# Patient Record
Sex: Female | Born: 2000 | Race: White | Hispanic: Yes | Marital: Single | Smoking: Never smoker
Health system: Southern US, Community
[De-identification: ages and names within clinical notes are randomized; demographics above are authoritative.]

## PROBLEM LIST (undated history)

## (undated) DIAGNOSIS — F419 Anxiety disorder, unspecified: Secondary | ICD-10-CM

## (undated) DIAGNOSIS — H9193 Unspecified hearing loss, bilateral: Secondary | ICD-10-CM

## (undated) DIAGNOSIS — D649 Anemia, unspecified: Secondary | ICD-10-CM

## (undated) DIAGNOSIS — I499 Cardiac arrhythmia, unspecified: Secondary | ICD-10-CM

## (undated) DIAGNOSIS — K589 Irritable bowel syndrome without diarrhea: Secondary | ICD-10-CM

## (undated) DIAGNOSIS — J45909 Unspecified asthma, uncomplicated: Secondary | ICD-10-CM

## (undated) DIAGNOSIS — F418 Other specified anxiety disorders: Secondary | ICD-10-CM

## (undated) DIAGNOSIS — R7303 Prediabetes: Secondary | ICD-10-CM

## (undated) DIAGNOSIS — K219 Gastro-esophageal reflux disease without esophagitis: Secondary | ICD-10-CM

## (undated) DIAGNOSIS — J302 Other seasonal allergic rhinitis: Secondary | ICD-10-CM

## (undated) DIAGNOSIS — T7840XA Allergy, unspecified, initial encounter: Secondary | ICD-10-CM

## (undated) DIAGNOSIS — J45990 Exercise induced bronchospasm: Secondary | ICD-10-CM

## (undated) DIAGNOSIS — E119 Type 2 diabetes mellitus without complications: Secondary | ICD-10-CM

## (undated) DIAGNOSIS — G43909 Migraine, unspecified, not intractable, without status migrainosus: Secondary | ICD-10-CM

## (undated) DIAGNOSIS — Z9049 Acquired absence of other specified parts of digestive tract: Secondary | ICD-10-CM

## (undated) HISTORY — DX: Migraine, unspecified, not intractable, without status migrainosus: G43.909

## (undated) HISTORY — DX: Irritable bowel syndrome, unspecified: K58.9

## (undated) HISTORY — DX: Morbid (severe) obesity due to excess calories: E66.01

## (undated) HISTORY — DX: Cardiac arrhythmia, unspecified: I49.9

## (undated) HISTORY — DX: Other seasonal allergic rhinitis: J30.2

## (undated) HISTORY — DX: Anemia, unspecified: D64.9

## (undated) HISTORY — PX: CHOLECYSTECTOMY: SHX55

## (undated) HISTORY — DX: Exercise induced bronchospasm: J45.990

## (undated) HISTORY — DX: Other specified anxiety disorders: F41.8

## (undated) HISTORY — DX: Allergy, unspecified, initial encounter: T78.40XA

## (undated) HISTORY — DX: Type 2 diabetes mellitus without complications: E11.9

## (undated) HISTORY — DX: Anxiety disorder, unspecified: F41.9

## (undated) HISTORY — DX: Prediabetes: R73.03

## (undated) HISTORY — DX: Acquired absence of other specified parts of digestive tract: Z90.49

---
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
## 2019-02-06 DIAGNOSIS — H9193 Unspecified hearing loss, bilateral: Secondary | ICD-10-CM | POA: Insufficient documentation

## 2019-08-12 ENCOUNTER — Ambulatory Visit (HOSPITAL_COMMUNITY)
Admission: EM | Admit: 2019-08-12 | Discharge: 2019-08-12 | Disposition: A | Discharge status: Home / Self Care | Payer: Medicaid Other | Attending: Radiology | Admitting: Radiology

## 2019-08-12 ENCOUNTER — Other Ambulatory Visit: Payer: Self-pay

## 2019-08-12 ENCOUNTER — Encounter (HOSPITAL_COMMUNITY): Payer: Self-pay

## 2019-08-12 DIAGNOSIS — J029 Acute pharyngitis, unspecified: Secondary | ICD-10-CM | POA: Diagnosis present

## 2019-08-12 DIAGNOSIS — Z20828 Contact with and (suspected) exposure to other viral communicable diseases: Secondary | ICD-10-CM | POA: Diagnosis not present

## 2019-08-12 LAB — POCT RAPID STREP A: Streptococcus, Group A Screen (Direct): NEGATIVE

## 2019-08-12 MED ORDER — CLARITIN-D 24 HOUR 10-240 MG PO TB24: 1.0000 | ORAL_TABLET | Freq: Every day | ORAL | 0 refills | Status: AC

## 2019-08-12 MED ORDER — CLINDAMYCIN HCL 150 MG PO CAPS: 300.0000 mg | ORAL_CAPSULE | Freq: Four times a day (QID) | ORAL | 0 refills | Status: AC

## 2019-08-12 NOTE — ED Triage Notes (Signed)
Pt present a sore throat, symptoms started on Thursday. Pt states it difficulty to swallow.

## 2019-08-12 NOTE — ED Provider Notes (Signed)
[facility]    CSN: [id number] Arrival date & time: 08/12/19  1439      History   Chief Complaint Chief Complaint  Patient presents with  . Sore Throat    HPI Sofie Rowerlsie Pavel is a 18 y.o. female.   18 y.o. female presents with sore throat  X 3 days. Condition is persistent in nature. Condition is made better by nothing. Condition is made worse by swallowing . Patient denies any treatment prior to there arrival at this facility. Patient reports a low grade fever of  99.1. denies any sinus or nasal congestion, headache, abdominal pain pain nausea vomiting or diarrhea.  Patient is a [employer] student who lives off campus. Patient denies any know exposures to COVID or strep      History reviewed. No pertinent past medical history.  There are no active problems to display for this patient.   Past Surgical History:  Procedure Laterality Date  . CHOLECYSTECTOMY      OB History   No obstetric history on file.      Home Medications    Prior to Admission medications   Not on File    Family History History reviewed. No pertinent family history.  Social History Social History   Tobacco Use  . Smoking status: Never Smoker  . Smokeless tobacco: Never Used  Substance Use Topics  . Alcohol use: Not on file  . Drug use: Not on file     Allergies   Penicillins   Review of Systems Review of Systems  Constitutional: Negative for chills and fever.  HENT: Positive for sore throat. Negative for congestion, ear pain and sinus pressure.   Eyes: Negative for pain and visual disturbance.  Respiratory: Negative for cough and shortness of breath.   Cardiovascular: Negative for chest pain and palpitations.  Gastrointestinal: Negative for abdominal pain and vomiting.  Genitourinary: Negative for dysuria and hematuria.  Musculoskeletal: Negative for arthralgias and back pain.  Skin: Negative for color change and rash.  Neurological: Negative for seizures and  syncope.  All other systems reviewed and are negative.    Physical Exam Triage Vital Signs ED Triage Vitals  Enc Vitals Group     BP 08/12/19 1500 127/68     Pulse Rate 08/12/19 1500 88     Resp --      Temp 08/12/19 1500 99.2 F (37.3 C)     Temp Source 08/12/19 1500 Skin     SpO2 08/12/19 1500 99 %     Weight --      Height --      Head Circumference --      Peak Flow --      Pain Score 08/12/19 1501 0     Pain Loc --      Pain Edu? --      Excl. in [place]? --    No data found.  Updated Vital Signs BP 127/68 (BP Location: Right Arm)   Pulse 88   Temp 99.2 F (37.3 C) (Skin)   SpO2 99%   Visual Acuity Right Eye Distance:   Left Eye Distance:   Bilateral Distance:    Right Eye Near:   Left Eye Near:    Bilateral Near:     Physical Exam Vitals signs and nursing note reviewed.  Constitutional:      Appearance: She is well-developed.     Comments: erythema noted no exudate.   HENT:     Head: Normocephalic and atraumatic.  Mouth/Throat:     Tonsils: No tonsillar exudate or tonsillar abscesses.  Eyes:     Conjunctiva/sclera: Conjunctivae normal.  Neck:     Musculoskeletal: Normal range of motion.  Pulmonary:     Effort: Pulmonary effort is normal.  Musculoskeletal: Normal range of motion.  Skin:    General: Skin is warm.  Neurological:     Mental Status: She is alert and oriented to person, place, and time.      UC Treatments / Results  Labs (all labs ordered are listed, but only abnormal results are displayed) Labs Reviewed - No data to display  EKG   Radiology No results found.  Procedures Procedures (including critical care time)  Medications Ordered in UC Medications - No data to display  Initial Impression / Assessment and Plan / UC Course  I have reviewed the triage vital signs and the nursing notes.  Pertinent labs & imaging results that were available during my care of the patient were reviewed by me and considered in my medical  decision making (see chart for details).      Final Clinical Impressions(s) / UC Diagnoses   Final diagnoses:  None   Discharge Instructions   None    ED Prescriptions    None     Controlled Substance Prescriptions [place] Controlled Substance Registry consulted? Not Applicable   Jacqualine Mau, NP 08/12/19 1520

## 2019-08-12 NOTE — Discharge Instructions (Signed)
f your COVID test is positive you will be contacted by the hospital. In the mean time please quarantine yourself. Continue to push fluids and take over the counter medications as directed on the back of the box for symptomatic relief.   If symptoms have not improved in 3-5 days please start antibiotics

## 2019-08-13 LAB — NOVEL CORONAVIRUS, NAA (HOSP ORDER, SEND-OUT TO REF LAB; TAT 18-24 HRS): SARS-CoV-2, NAA: NOT DETECTED

## 2019-08-14 LAB — CULTURE, GROUP A STREP (THRC)

## 2019-09-19 ENCOUNTER — Ambulatory Visit (HOSPITAL_COMMUNITY)
Admission: EM | Admit: 2019-09-19 | Discharge: 2019-09-19 | Disposition: A | Discharge status: Home / Self Care | Payer: Medicaid Other

## 2019-09-19 ENCOUNTER — Other Ambulatory Visit: Payer: Self-pay

## 2019-09-19 ENCOUNTER — Encounter (HOSPITAL_COMMUNITY): Payer: Self-pay | Admitting: Emergency Medicine

## 2019-09-19 DIAGNOSIS — H60501 Unspecified acute noninfective otitis externa, right ear: Secondary | ICD-10-CM | POA: Diagnosis not present

## 2019-09-19 HISTORY — DX: Unspecified hearing loss, bilateral: H91.93

## 2019-09-19 MED ORDER — NEOMYCIN-POLYMYXIN-HC 3.5-10000-1 OT SUSP: 4.0000 [drp] | Freq: Three times a day (TID) | OTIC | 0 refills | Status: DC

## 2019-09-19 NOTE — ED Notes (Signed)
Bed: UC05 Expected date:  Expected time:  Means of arrival:  Comments: Appt 1330

## 2019-09-19 NOTE — ED Triage Notes (Signed)
Pt reports right ear pain and ringing in the ear that started last night.  Pt wears hearing aids, but is unable to wear the one in the right ear at this time.  No OTC medications have been taken.

## 2019-09-19 NOTE — Discharge Instructions (Signed)
Use the eardrops as directed.    Follow-up with your ENT or the one suggested below if your symptoms persist or worsen.

## 2019-09-19 NOTE — ED Provider Notes (Signed)
[facility]    CSN: [id number] Arrival date & time: 09/19/19  1326      History   Chief Complaint Chief Complaint  Patient presents with  . Tinnitus    Appt 1330 right  . Otalgia    right    HPI Sara Cervantes is a 18 y.o. female.   Patient presents with right ear pain since yesterday.  She states her ear canal is tender and she is unable to put her hearing aid in due to the tenderness.  She has bilateral hearing aids.  She denies fever, chills, sore throat, cough, shortness of breath, vomiting, diarrhea, or other symptoms.  The history is provided by the patient.    Past Medical History:  Diagnosis Date  . Hearing deficit, bilateral     There are no active problems to display for this patient.   Past Surgical History:  Procedure Laterality Date  . CHOLECYSTECTOMY      OB History   No obstetric history on file.      Home Medications    Prior to Admission medications   Medication Sig Start Date End Date Taking? Authorizing Provider  etonogestrel (NEXPLANON) 68 MG IMPL implant Inject into the skin.   Yes [provider]  hydrOXYzine (ATARAX/VISTARIL) 10 MG tablet Take by mouth. 09/04/19  Yes [provider]  sertraline (ZOLOFT) 100 MG tablet Take 100 mg by mouth daily. 07/23/19  Yes [provider]  TRULANCE 3 MG TABS Take 1 tablet by mouth daily. 06/14/19  Yes [provider]  neomycin-polymyxin-hydrocortisone (CORTISPORIN) 3.5-10000-1 OTIC suspension Place 4 drops into the right ear 3 (three) times daily. 09/19/19   Sharion Balloon, NP    Family History Family History  Problem Relation Age of Onset  . Hypertension Mother   . Diabetes Father   . Hypertension Father   . Healthy Brother     Social History Social History   Tobacco Use  . Smoking status: Never Smoker  . Smokeless tobacco: Never Used  Substance Use Topics  . Alcohol use: Never    Frequency: Never  . Drug use: Never     Allergies    Penicillins   Review of Systems Review of Systems  Constitutional: Negative for chills and fever.  HENT: Positive for ear pain. Negative for sore throat.   Eyes: Negative for pain and visual disturbance.  Respiratory: Negative for cough and shortness of breath.   Cardiovascular: Negative for chest pain and palpitations.  Gastrointestinal: Negative for abdominal pain and vomiting.  Genitourinary: Negative for dysuria and hematuria.  Musculoskeletal: Negative for arthralgias and back pain.  Skin: Negative for color change and rash.  Neurological: Negative for seizures and syncope.  All other systems reviewed and are negative.    Physical Exam Triage Vital Signs ED Triage Vitals [09/19/19 1357]  Enc Vitals Group     BP      Pulse      Resp      Temp      Temp src      SpO2      Weight      Height      Head Circumference      Peak Flow      Pain Score 6     Pain Loc      Pain Edu?      Excl. in [place]?    No data found.  Updated Vital Signs BP 133/87 (BP Location: Right Wrist)   Pulse 78  Temp 98.5 F (36.9 C) (Oral)   Resp 18   SpO2 100%   Visual Acuity Right Eye Distance:   Left Eye Distance:   Bilateral Distance:    Right Eye Near:   Left Eye Near:    Bilateral Near:     Physical Exam Vitals signs and nursing note reviewed.  Constitutional:      General: She is not in acute distress.    Appearance: She is well-developed.  HENT:     Head: Normocephalic and atraumatic.     Right Ear: Tympanic membrane normal.     Left Ear: Tympanic membrane and ear canal normal.     Ears:     Comments: Mild erythema in right ear canal. No drainage.    Nose: Nose normal.     Mouth/Throat:     Mouth: Mucous membranes are moist.     Pharynx: Oropharynx is clear.  Eyes:     Conjunctiva/sclera: Conjunctivae normal.  Neck:     Musculoskeletal: Neck supple.  Cardiovascular:     Rate and Rhythm: Normal rate and regular rhythm.     Heart sounds: No murmur.  Pulmonary:      Effort: Pulmonary effort is normal. No respiratory distress.     Breath sounds: Normal breath sounds.  Abdominal:     Palpations: Abdomen is soft.     Tenderness: There is no abdominal tenderness. There is no guarding or rebound.  Skin:    General: Skin is warm and dry.     Findings: No rash.  Neurological:     Mental Status: She is alert.      UC Treatments / Results  Labs (all labs ordered are listed, but only abnormal results are displayed) Labs Reviewed - No data to display  EKG   Radiology No results found.  Procedures Procedures (including critical care time)  Medications Ordered in UC Medications - No data to display  Initial Impression / Assessment and Plan / UC Course  I have reviewed the triage vital signs and the nursing notes.  Pertinent labs & imaging results that were available during my care of the patient were reviewed by me and considered in my medical decision making (see chart for details).    Right otitis externa.  Treating with Cortisporin drops.  Instructed patient to follow-up with her ENT or the one suggested if her ear is not improving or gets worse.  Instructed her that she can return here if she develops other symptoms such as fever, sore throat, cough, or other concerns.  Patient agrees to plan of care.     Final Clinical Impressions(s) / UC Diagnoses   Final diagnoses:  Acute otitis externa of right ear, unspecified type     Discharge Instructions     Use the eardrops as directed.    Follow-up with your ENT or the one suggested below if your symptoms persist or worsen.        ED Prescriptions    Medication Sig Dispense Auth. Provider   neomycin-polymyxin-hydrocortisone (CORTISPORIN) 3.5-10000-1 OTIC suspension Place 4 drops into the right ear 3 (three) times daily. 10 mL Mickie Bail, NP     PDMP not reviewed this encounter.   Mickie Bail, NP 09/19/19 [phone]

## 2019-11-23 ENCOUNTER — Ambulatory Visit (INDEPENDENT_AMBULATORY_CARE_PROVIDER_SITE_OTHER): Payer: Medicaid Other

## 2019-11-23 ENCOUNTER — Encounter (HOSPITAL_COMMUNITY): Payer: Self-pay

## 2019-11-23 ENCOUNTER — Other Ambulatory Visit: Payer: Self-pay

## 2019-11-23 ENCOUNTER — Ambulatory Visit (HOSPITAL_COMMUNITY)
Admission: EM | Admit: 2019-11-23 | Discharge: 2019-11-23 | Disposition: A | Discharge status: Home / Self Care | Payer: Medicaid Other

## 2019-11-23 DIAGNOSIS — M25511 Pain in right shoulder: Secondary | ICD-10-CM | POA: Diagnosis not present

## 2019-11-23 MED ORDER — PREDNISONE 10 MG (21) PO TBPK: ORAL_TABLET | ORAL | 0 refills | Status: DC

## 2019-11-23 NOTE — ED Triage Notes (Signed)
Pt presents to the UC with right shoulder pain and right ram pain x 3 days. Pt reports having numbness sensation in her right fingers.

## 2019-11-23 NOTE — ED Provider Notes (Addendum)
[facility]    CSN: [id number] Arrival date & time: 11/23/19  1021      History   Chief Complaint Chief Complaint  Patient presents with  . Shoulder Pain  . Arm Pain    HPI Sara Cervantes is a 18 y.o. female.   Patient is a 18 year old female that presents today with right shoulder pain that radiates down the right arm to the elbow.  This is been constant, waxing waning over the past 3 days. She woke up this way.  She has been taking Tylenol and Advil with some relief.  She does have some numbness and tingling sensation and the fourth and fifth finger right hand.  Denies any injuries to the shoulder.  Denies any recent heavy lifting.  No fever.  Very limited range of motion.   ROS per HPI    Shoulder Pain Arm Pain    Past Medical History:  Diagnosis Date  . Hearing deficit, bilateral     There are no problems to display for this patient.   Past Surgical History:  Procedure Laterality Date  . CHOLECYSTECTOMY      OB History   No obstetric history on file.      Home Medications    Prior to Admission medications   Medication Sig Start Date End Date Taking? Authorizing Provider  etonogestrel (NEXPLANON) 68 MG IMPL implant Inject into the skin.    [provider]  hydrOXYzine (ATARAX/VISTARIL) 10 MG tablet Take by mouth. 09/04/19   [provider]  metFORMIN (GLUCOPHAGE) 500 MG tablet TAKE 1/2 TABLET BY MOUTH TWICE A DAY WITH MEALS. 08/28/19   [provider]  neomycin-polymyxin-hydrocortisone (CORTISPORIN) 3.5-10000-1 OTIC suspension Place 4 drops into the right ear 3 (three) times daily. 09/19/19   Mickie Bail, NP  predniSONE (STERAPRED UNI-PAK 21 TAB) 10 MG (21) TBPK tablet 6 tabs for 1 day, then 5 tabs for 1 das, then 4 tabs for 1 day, then 3 tabs for 1 day, 2 tabs for 1 day, then 1 tab for 1 day 11/23/19   Dahlia Byes A, NP  sertraline (ZOLOFT) 100 MG tablet Take 100 mg by mouth daily. 07/23/19   [provider]  TRULANCE 3 MG TABS Take 1 tablet by mouth daily. 06/14/19   [provider]    Family History Family History  Problem Relation Age of Onset  . Hypertension Mother   . Diabetes Father   . Hypertension Father   . Healthy Brother     Social History Social History   Tobacco Use  . Smoking status: Never Smoker  . Smokeless tobacco: Never Used  Substance Use Topics  . Alcohol use: Never  . Drug use: Never     Allergies   Penicillins   Review of Systems Review of Systems   Physical Exam Triage Vital Signs ED Triage Vitals  Enc Vitals Group     BP 11/23/19 1040 116/78     Pulse Rate 11/23/19 1040 79     Resp 11/23/19 1040 16     Temp 11/23/19 1040 98.2 F (36.8 C)     Temp Source 11/23/19 1040 Oral     SpO2 11/23/19 1040 97 %     Weight --      Height --      Head Circumference --      Peak Flow --      Pain Score 11/23/19 1038 9     Pain Loc --  Pain Edu? --      Excl. in [place]? --    No data found.  Updated Vital Signs BP 116/78 (BP Location: Left Arm)   Pulse 79   Temp 98.2 F (36.8 C) (Oral)   Resp 16   SpO2 97%   Visual Acuity Right Eye Distance:   Left Eye Distance:   Bilateral Distance:    Right Eye Near:   Left Eye Near:    Bilateral Near:     Physical Exam Vitals and nursing note reviewed.  Constitutional:      General: She is not in acute distress.    Appearance: Normal appearance. She is not ill-appearing, toxic-appearing or diaphoretic.  HENT:     Head: Normocephalic.     Nose: Nose normal.     Mouth/Throat:     Pharynx: Oropharynx is clear.  Eyes:     Conjunctiva/sclera: Conjunctivae normal.  Pulmonary:     Effort: Pulmonary effort is normal.  Musculoskeletal:     Right shoulder: Tenderness and bony tenderness present. No swelling, deformity, effusion or laceration. Decreased range of motion. Normal strength. Normal pulse.     Left shoulder: Normal.     Cervical back: Normal range of motion.  Skin:    General:  Skin is warm and dry.     Findings: No rash.  Neurological:     Mental Status: She is alert.  Psychiatric:        Mood and Affect: Mood normal.      UC Treatments / Results  Labs (all labs ordered are listed, but only abnormal results are displayed) Labs Reviewed - No data to display  EKG   Radiology DG Shoulder Right  Result Date: 11/23/2019 CLINICAL DATA:  Right shoulder pain and limited range of motion for 3 days. No known injury. EXAM: RIGHT SHOULDER - 2+ VIEW COMPARISON:  None. FINDINGS: There is no evidence of fracture or dislocation. There is no evidence of arthropathy or other focal bone abnormality. Soft tissues are unremarkable. IMPRESSION: Normal exam. Electronically Signed   By: Inge Rise M.D.   On: 11/23/2019 11:21    Procedures Procedures (including critical care time)  Medications Ordered in UC Medications - No data to display  Initial Impression / Assessment and Plan / UC Course  I have reviewed the triage vital signs and the nursing notes.  Pertinent labs & imaging results that were available during my care of the patient were reviewed by me and considered in my medical decision making (see chart for details).     Shoulder pain with radiculopathy. X-ray negative for anything acute. Is likely some sort of nerve impingement causing his pain and symptoms. We will treat with prednisone taper over the next 6 days and have her follow-up for any continued or worsening problems. Recommended trying to do range of motion to avoid frozen shoulder  Final Clinical Impressions(s) / UC Diagnoses   Final diagnoses:  Pain in joint of right shoulder     Discharge Instructions     Your x ray did not show anything abnormal.  I believe this may be nerve related with the symptoms you are experiencing.  We will try a round of prednisone to see if this helps.  Try doing gentle movements with the shoulder to avoid frozen shoulder.  If this continues or worsens  you will need to see orthopedics.      ED Prescriptions    Medication Sig Dispense Auth. Provider   predniSONE (STERAPRED UNI-PAK 21  TAB) 10 MG (21) TBPK tablet 6 tabs for 1 day, then 5 tabs for 1 das, then 4 tabs for 1 day, then 3 tabs for 1 day, 2 tabs for 1 day, then 1 tab for 1 day 21 tablet Kingdom Vanzanten A, NP     PDMP not reviewed this encounter.   Janace ArisBast, Parminder Trapani A, NP 11/23/19 1146    Dahlia ByesBast, Offie Waide A, NP 11/23/19 1147

## 2019-11-23 NOTE — Discharge Instructions (Signed)
Your x ray did not show anything abnormal.  I believe this may be nerve related with the symptoms you are experiencing.  We will try a round of prednisone to see if this helps.  Try doing gentle movements with the shoulder to avoid frozen shoulder.  If this continues or worsens you will need to see orthopedics.

## 2019-12-15 ENCOUNTER — Other Ambulatory Visit: Payer: Self-pay

## 2019-12-15 ENCOUNTER — Ambulatory Visit
Admission: EM | Admit: 2019-12-15 | Discharge: 2019-12-15 | Disposition: A | Discharge status: Home / Self Care | Payer: Medicaid Other | Attending: Physician Assistant | Admitting: Physician Assistant

## 2019-12-15 DIAGNOSIS — Z3202 Encounter for pregnancy test, result negative: Secondary | ICD-10-CM | POA: Diagnosis not present

## 2019-12-15 DIAGNOSIS — N939 Abnormal uterine and vaginal bleeding, unspecified: Secondary | ICD-10-CM

## 2019-12-15 LAB — POCT URINE PREGNANCY: Preg Test, Ur: NEGATIVE

## 2019-12-15 MED ORDER — MEGESTROL ACETATE 40 MG PO TABS: 40.0000 mg | ORAL_TABLET | Freq: Two times a day (BID) | ORAL | 0 refills | Status: DC

## 2019-12-15 NOTE — ED Triage Notes (Signed)
Pt states had a nexplanon placed 2019, has had no menstral cycles. States last week start spotting and now having heavy bleeding using 3-4 pads per hour. Pt c/o lower abdominal pain with bloating this week. States had a negative pregnancy test.

## 2019-12-15 NOTE — ED Provider Notes (Signed)
[facility]    CSN: [id number] Arrival date & time: 12/15/19  1000      History   Chief Complaint Chief Complaint  Patient presents with  . Vaginal Discharge    HPI Sara Cervantes is a 19 y.o. female.   19 year old female comes in for vaginal bleeding.  Patient has been on birth control for the past 2 to 3 years, and has not had cycles during this period of time.  Currently with Nexplanon that was placed 2019.  States for the past 4 days, has had significant vaginal bleeding, going through 3-4 pads an hour.  She has had lower abdominal pain that is constant, waxing and waning in intensity.  Denies nausea, vomiting.  Denies diarrhea, but feels bloated.  Denies urinary frequency, dysuria, hematuria.  Denies vaginal discharge, itching.  Denies fever, chills, body aches.  Sexually active with one female partner, consistent condom use.   Nexplanon placed by GYN in [place], [place]. Can still feel nexplanon without changes in location. Denies swelling, erythema, warmth to area.      Past Medical History:  Diagnosis Date  . Hearing deficit, bilateral     There are no problems to display for this patient.   Past Surgical History:  Procedure Laterality Date  . CHOLECYSTECTOMY      OB History   No obstetric history on file.      Home Medications    Prior to Admission medications   Medication Sig Start Date End Date Taking? Authorizing Provider  cetirizine (ZYRTEC) 10 MG tablet Take 10 mg by mouth daily.   Yes [provider]  famotidine (PEPCID) 20 MG tablet Take 20 mg by mouth 2 (two) times daily.   Yes [provider]  etonogestrel (NEXPLANON) 68 MG IMPL implant Inject into the skin.    [provider]  hydrOXYzine (ATARAX/VISTARIL) 10 MG tablet Take by mouth. 09/04/19   [provider]  megestrol (MEGACE) 40 MG tablet Take 1 tablet (40 mg total) by mouth 2 (two) times daily. 12/15/19   Cathie Hoops, Jessi Jessop V, PA-C  metFORMIN (GLUCOPHAGE)  500 MG tablet TAKE 1/2 TABLET BY MOUTH TWICE A DAY WITH MEALS. 08/28/19   [provider]  sertraline (ZOLOFT) 100 MG tablet Take 100 mg by mouth daily. 07/23/19   [provider]  TRULANCE 3 MG TABS Take 1 tablet by mouth daily. 06/14/19   [provider]    Family History Family History  Problem Relation Age of Onset  . Hypertension Mother   . Diabetes Father   . Hypertension Father   . Healthy Brother     Social History Social History   Tobacco Use  . Smoking status: Never Smoker  . Smokeless tobacco: Never Used  Substance Use Topics  . Alcohol use: Never  . Drug use: Never     Allergies   Penicillins   Review of Systems Review of Systems  Reason unable to perform ROS: See HPI as above.     Physical Exam Triage Vital Signs ED Triage Vitals  Enc Vitals Group     BP 12/15/19 1009 109/63     Pulse Rate 12/15/19 1009 78     Resp 12/15/19 1009 16     Temp 12/15/19 1009 98.4 F (36.9 C)     Temp Source 12/15/19 1009 Oral     SpO2 12/15/19 1009 97 %     Weight --      Height --  Head Circumference --      Peak Flow --      Pain Score 12/15/19 1017 9     Pain Loc --      Pain Edu? --      Excl. in [place]? --    No data found.  Updated Vital Signs BP 109/63 (BP Location: Left Arm)   Pulse 78   Temp 98.4 F (36.9 C) (Oral)   Resp 16   SpO2 97%   Physical Exam Exam conducted with a chaperone present.  Constitutional:      General: She is not in acute distress.    Appearance: She is well-developed. She is not ill-appearing, toxic-appearing or diaphoretic.  HENT:     Head: Normocephalic and atraumatic.  Eyes:     Conjunctiva/sclera: Conjunctivae normal.     Pupils: Pupils are equal, round, and reactive to light.  Cardiovascular:     Rate and Rhythm: Normal rate and regular rhythm.     Heart sounds: Normal heart sounds. No murmur. No friction rub. No gallop.   Pulmonary:     Effort: Pulmonary effort is normal.     Breath  sounds: Normal breath sounds. No wheezing or rales.  Abdominal:     General: Bowel sounds are normal.     Palpations: Abdomen is soft.     Tenderness: There is no abdominal tenderness. There is no right CVA tenderness, left CVA tenderness, guarding or rebound.  Genitourinary:    Labia:        Right: No rash.        Left: No rash.      Vagina: Bleeding present. No vaginal discharge.     Cervix: No cervical motion tenderness, friability or erythema.     Uterus: Normal.      Adnexa: Right adnexa normal and left adnexa normal.  Skin:    General: Skin is warm and dry.  Neurological:     Mental Status: She is alert and oriented to person, place, and time.  Psychiatric:        Behavior: Behavior normal.        Judgment: Judgment normal.      UC Treatments / Results  Labs (all labs ordered are listed, but only abnormal results are displayed) Labs Reviewed  POCT URINE PREGNANCY  CERVICOVAGINAL ANCILLARY ONLY    EKG   Radiology No results found.  Procedures Procedures (including critical care time)  Medications Ordered in UC Medications - No data to display  Initial Impression / Assessment and Plan / UC Course  I have reviewed the triage vital signs and the nursing notes.  Pertinent labs & imaging results that were available during my care of the patient were reviewed by me and considered in my medical decision making (see chart for details).    Discussed case with on-call MAU APP. Will start megace for AUB/breakthrough bleeding given presence of nexplanon. Current patient without tachycardia, able to ambulate on own, low suspicion for acute blood loss requiring transfusion, will have patient monitor closely. Push fluid. Patient would like  Cytology as well, testing sent. Return precautions given. Patient expresses understanding and agrees to plan.    Final Clinical Impressions(s) / UC Diagnoses   Final diagnoses:  Abnormal uterine bleeding (AUB)    ED Prescriptions     Medication Sig Dispense Auth. Provider   megestrol (MEGACE) 40 MG tablet Take 1 tablet (40 mg total) by mouth 2 (two) times daily. 20 tablet Belinda Fisher, PA-C  PDMP not reviewed this encounter.   Ok Edwards, PA-C 12/15/19 [phone]

## 2019-12-15 NOTE — Discharge Instructions (Addendum)
Start megace as directed, and can stop when bleeding stops. If bleeding stops and does not restart, you will not need follow up. If bleeding restarts, will need to follow up with GYN for further evaluation and management needed. Cytology sent, you will be contacted with any positive results that requires further treatment. Refrain from sexual activity and alcohol use for the next 7 days. Monitor for any worsening of symptoms, fever, abdominal pain, nausea, vomiting, to follow up for reevaluation.

## 2019-12-19 LAB — CERVICOVAGINAL ANCILLARY ONLY
Bacterial vaginitis: NEGATIVE
Candida vaginitis: NEGATIVE
Chlamydia: NEGATIVE
Neisseria Gonorrhea: NEGATIVE
Trichomonas: NEGATIVE

## 2019-12-22 ENCOUNTER — Ambulatory Visit (INDEPENDENT_AMBULATORY_CARE_PROVIDER_SITE_OTHER): Payer: Medicaid Other | Admitting: Podiatry

## 2019-12-22 ENCOUNTER — Other Ambulatory Visit: Payer: Self-pay

## 2019-12-22 DIAGNOSIS — M79676 Pain in unspecified toe(s): Secondary | ICD-10-CM | POA: Diagnosis not present

## 2019-12-22 DIAGNOSIS — L600 Ingrowing nail: Secondary | ICD-10-CM

## 2019-12-22 MED ORDER — NEOMYCIN-POLYMYXIN-HC 3.5-10000-1 OT SOLN: OTIC | 0 refills | Status: DC

## 2019-12-22 NOTE — Patient Instructions (Signed)

## 2019-12-22 NOTE — Progress Notes (Signed)
  Subjective:  Patient ID: Sara Cervantes, female    DOB: Jan 18, 2001,  MRN: [id number]  Chief Complaint  Patient presents with  . Nail Problem    Bilateral 1st toenails entire nail painful 3 year duration off and on. Pt states occasional pus/blood drainage with redness and swelling.  . Nail Problem    Bilateral 2-5 toenails occasional discomfort.   19 y.o. female presents with the above complaint. History confirmed with patient.   Objective:  Physical Exam: warm, good capillary refill, no trophic changes or ulcerative lesions, normal DP and PT pulses and normal sensory exam.  Painful ingrowing nail at both borders of the right, hallux; without warmth, erythema or drainage  Assessment:   1. Ingrown nail   2. Pain around toenail      Plan:  Patient was evaluated and treated and all questions answered.  Ingrown Nail, right -Patient elects to proceed with ingrown toenail removal today -Ingrown nail excised. See procedure note. -Educated on post-procedure care including soaking. Written instructions provided. -Rx for Cortisporin drops -Patient to follow up in 2 weeks for nail check.  Procedure: Excision of Ingrown Toenail Location: Right 1st toe both nail borders. Anesthesia: Lidocaine 1% plain; 1.64mL and Marcaine 0.5% plain; 1.90mL, digital block. Skin Prep: Betadine. Dressing: Silvadene; telfa; dry, sterile, compression dressing. Technique: Following skin prep, the toe was exsanguinated and a tourniquet was secured at the base of the toe. The affected nail border was freed, split with a nail splitter, and excised. Chemical matrixectomy was then performed with phenol and irrigated out with alcohol. The tourniquet was then removed and sterile dressing applied. Disposition: Patient tolerated procedure well. Patient to return in 2 weeks for follow-up. Return in about 2 weeks (around 01/05/2020) for Nail Check.   MDM

## 2019-12-28 ENCOUNTER — Other Ambulatory Visit: Payer: Self-pay

## 2019-12-28 ENCOUNTER — Ambulatory Visit
Admission: EM | Admit: 2019-12-28 | Discharge: 2019-12-28 | Disposition: A | Discharge status: Home / Self Care | Payer: Medicaid Other | Attending: Emergency Medicine | Admitting: Emergency Medicine

## 2019-12-28 ENCOUNTER — Encounter: Payer: Self-pay | Admitting: Emergency Medicine

## 2019-12-28 DIAGNOSIS — J069 Acute upper respiratory infection, unspecified: Secondary | ICD-10-CM | POA: Diagnosis not present

## 2019-12-28 DIAGNOSIS — Z20822 Contact with and (suspected) exposure to covid-19: Secondary | ICD-10-CM

## 2019-12-28 MED ORDER — LIDOCAINE VISCOUS HCL 2 % MT SOLN: 15.0000 mL | OROMUCOSAL | 0 refills | Status: DC | PRN

## 2019-12-28 MED ORDER — BENZONATATE 100 MG PO CAPS: 100.0000 mg | ORAL_CAPSULE | Freq: Three times a day (TID) | ORAL | 0 refills | Status: DC

## 2019-12-28 NOTE — ED Provider Notes (Signed)
[facility]    CSN: [id number] Arrival date & time: 12/28/19  1049      History   Chief Complaint Chief Complaint  Patient presents with  . URI    HPI Sara Cervantes is a 19 y.o. female presenting for 5-day course of nasal congestion, dry cough, sore throat, generalized headaches.  States headaches are intermittent and without change in vision, hearing.  Patient feels that sore throat is worse in the morning "from drainage ".  Patient has tried Mucinex, Robitussin, allergy medications, Flonase without significant relief.  Denies fever, chills, myalgias, arthralgias, sinus pain/pressure, chest pain, wheezing, shortness of breath.  Good appetite/oral intake.  Patient states she underwent Covid testing yesterday outside facility: Unsure name of location, type of test, or how she will are back about results, "I think they are to call me ".  Patient does attend school and work: Requesting notes related to Covid testing/symptoms.   Past Medical History:  Diagnosis Date  . Hearing deficit, bilateral     Patient Active Problem List   Diagnosis Date Noted  . Bilateral hearing loss 02/06/2019  . Depression with anxiety 11/04/2018  . Environmental and seasonal allergies 11/04/2018  . Exercise-induced asthma 11/04/2018  . Migraine without aura and without status migrainosus, not intractable 11/04/2018  . Obesity, Class III, BMI 40-49.9 (morbid obesity) (HCC) 11/04/2018  . Irritable bowel syndrome 03/03/2018  . S/P laparoscopic cholecystectomy 02/06/2017    Past Surgical History:  Procedure Laterality Date  . CHOLECYSTECTOMY      OB History   No obstetric history on file.      Home Medications    Prior to Admission medications   Medication Sig Start Date End Date Taking? Authorizing Provider  albuterol (VENTOLIN HFA) 108 (90 Base) MCG/ACT inhaler SMARTSIG:2 Puff(s) By Mouth Every 6 Hours PRN 12/02/19   [provider]  benzonatate (TESSALON) 100 MG  capsule Take 1 capsule (100 mg total) by mouth every 8 (eight) hours. 12/28/19   Hall-Potvin, [place], PA-C  cetirizine (ZYRTEC) 10 MG tablet Take 10 mg by mouth daily.    [provider]  eletriptan (RELPAX) 20 MG tablet Take 20 mg by mouth 2 (two) times daily as needed. 12/02/19   [provider]  etonogestrel (NEXPLANON) 68 MG IMPL implant Inject into the skin.    [provider]  famotidine (PEPCID) 20 MG tablet Take 20 mg by mouth 2 (two) times daily.    [provider]  fluticasone (FLONASE) 50 MCG/ACT nasal spray Place into the nose. 11/30/19   [provider]  hydrOXYzine (ATARAX/VISTARIL) 10 MG tablet Take by mouth. 09/04/19   [provider]  lidocaine (XYLOCAINE) 2 % solution Use as directed 15 mLs in the mouth or throat as needed for mouth pain. 12/28/19   Hall-Potvin, [place], PA-C  megestrol (MEGACE) 40 MG tablet Take 1 tablet (40 mg total) by mouth 2 (two) times daily. 12/15/19   Cathie Hoops, Amy V, PA-C  metFORMIN (GLUCOPHAGE) 500 MG tablet TAKE 1/2 TABLET BY MOUTH TWICE A DAY WITH MEALS. 08/28/19   [provider]  metFORMIN (GLUCOPHAGE) 850 MG tablet Take 1 tablet by mouth every morning  with food x 1 week. Then increase to 1 tab twice daily with food if tolerated. 08/03/19   [provider]  sertraline (ZOLOFT) 100 MG tablet Take 100 mg by mouth daily. 07/23/19   [provider]  TRULANCE 3 MG TABS Take 1 tablet by mouth daily. 06/14/19   [provider]  Family History Family History  Problem Relation Age of Onset  . Hypertension Mother   . Diabetes Father   . Hypertension Father   . Healthy Brother     Social History Social History   Tobacco Use  . Smoking status: Never Smoker  . Smokeless tobacco: Never Used  Substance Use Topics  . Alcohol use: Never  . Drug use: Never     Allergies   Penicillins   Review of Systems As per HPI   Physical Exam Triage Vital Signs ED Triage  Vitals  Enc Vitals Group     BP      Pulse      Resp      Temp      Temp src      SpO2      Weight      Height      Head Circumference      Peak Flow      Pain Score      Pain Loc      Pain Edu?      Excl. in [place]?    No data found.  Updated Vital Signs BP 127/80 (BP Location: Right Arm)   Pulse 85   Temp 97.6 F (36.4 C) (Temporal)   Resp 18   SpO2 96%   Visual Acuity Right Eye Distance:   Left Eye Distance:   Bilateral Distance:    Right Eye Near:   Left Eye Near:    Bilateral Near:     Physical Exam Constitutional:      General: She is not in acute distress.    Appearance: She is obese. She is not ill-appearing.  HENT:     Head: Normocephalic and atraumatic.     Jaw: There is normal jaw occlusion. No tenderness or pain on movement.     Right Ear: Hearing, tympanic membrane, ear canal and external ear normal. No tenderness. No mastoid tenderness.     Left Ear: Hearing, tympanic membrane, ear canal and external ear normal. No tenderness. No mastoid tenderness.     Nose: No nasal deformity, septal deviation or nasal tenderness.     Right Turbinates: Not swollen or pale.     Left Turbinates: Not swollen or pale.     Right Sinus: No maxillary sinus tenderness or frontal sinus tenderness.     Left Sinus: No maxillary sinus tenderness or frontal sinus tenderness.     Comments: Negative sinus tenderness bilaterally    Mouth/Throat:     Lips: Pink. No lesions.     Mouth: Mucous membranes are moist. No injury.     Pharynx: Oropharynx is clear. Uvula midline. No posterior oropharyngeal erythema or uvula swelling.     Comments: no tonsillar exudate or hypertrophy Eyes:     General: No scleral icterus.    Conjunctiva/sclera: Conjunctivae normal.     Pupils: Pupils are equal, round, and reactive to light.  Cardiovascular:     Rate and Rhythm: Normal rate and regular rhythm.  Pulmonary:     Effort: Pulmonary effort is normal. No respiratory distress.     Breath  sounds: No wheezing.  Musculoskeletal:     Cervical back: Normal range of motion and neck supple. No tenderness. No muscular tenderness.  Lymphadenopathy:     Cervical: No cervical adenopathy.  Skin:    Capillary Refill: Capillary refill takes less than 2 seconds.     Coloration: Skin is not jaundiced or pale.     Findings: No rash.  Neurological:     General: No focal deficit present.     Mental Status: She is alert and oriented to person, place, and time.      UC Treatments / Results  Labs (all labs ordered are listed, but only abnormal results are displayed) Labs Reviewed  NOVEL CORONAVIRUS, NAA    EKG   Radiology No results found.  Procedures Procedures (including critical care time)  Medications Ordered in UC Medications - No data to display  Initial Impression / Assessment and Plan / UC Course  I have reviewed the triage vital signs and the nursing notes.  Pertinent labs & imaging results that were available during my care of the patient were reviewed by me and considered in my medical decision making (see chart for details).    I have reviewed the triage vital signs and the nursing notes.  All pertinent labs & imaging results that were available during my care of the patient were reviewed by me and considered in my medical decision making (see chart for details).  Patient afebrile, nontoxic, with SpO2 96%.  Covid PCR pending as we are providing work note, going to take over patient care from external facility as preferred by patient.  Patient to quarantine until results are back.  We will continue supportive management, add benzonatate and lidocaine for sore throat, cough.  Return precautions discussed, patient verbalized understanding and is agreeable to plan. Final Clinical Impressions(s) / UC Diagnoses   Final diagnoses:  Viral URI with cough     Discharge Instructions     Your COVID test is pending - it is important to quarantine / isolate at home  until your results are back. If you test positive and would like further evaluation for persistent or worsening symptoms, you may schedule an E-visit or virtual (video) visit throughout the [facility] app or website.  PLEASE NOTE: If you develop severe chest pain or shortness of breath please go to the ER or call 9-1-1 for further evaluation --> DO NOT schedule electronic or virtual visits for this. Please call our office for further guidance / recommendations as needed.  For information about the Covid vaccine, please visit [email or web address]    ED Prescriptions    Medication Sig Dispense Auth. Provider   benzonatate (TESSALON) 100 MG capsule Take 1 capsule (100 mg total) by mouth every 8 (eight) hours. 21 capsule Hall-Potvin, [place], PA-C   lidocaine (XYLOCAINE) 2 % solution Use as directed 15 mLs in the mouth or throat as needed for mouth pain. 100 mL Hall-Potvin, [place], PA-C     PDMP not reviewed this encounter.   Hall-Potvin, [place], [place] 12/28/19 1525

## 2019-12-28 NOTE — Discharge Instructions (Signed)
Your COVID test is pending - it is important to quarantine / isolate at home until your results are back. °If you test positive and would like further evaluation for persistent or worsening symptoms, you may schedule an E-visit or virtual (video) visit throughout the [facility] app or website. ° °PLEASE NOTE: If you develop severe chest pain or shortness of breath please go to the ER or call 9-1-1 for further evaluation --> DO NOT schedule electronic or virtual visits for this. °Please call our office for further guidance / recommendations as needed. ° °For information about the Covid vaccine, please visit [email or web address] °

## 2019-12-28 NOTE — ED Triage Notes (Signed)
Pt presents to [facility] for 5 days of nasal congestion, cough, sore throat, headaches.  Denies n/v/d.    Pt has tried mucinex, robitusson, allergy meds, and nasal sprays without relief.

## 2019-12-29 LAB — NOVEL CORONAVIRUS, NAA: SARS-CoV-2, NAA: NOT DETECTED

## 2020-01-05 ENCOUNTER — Ambulatory Visit (INDEPENDENT_AMBULATORY_CARE_PROVIDER_SITE_OTHER): Payer: Medicaid Other | Admitting: Podiatry

## 2020-01-05 ENCOUNTER — Other Ambulatory Visit: Payer: Self-pay

## 2020-01-05 DIAGNOSIS — L600 Ingrowing nail: Secondary | ICD-10-CM | POA: Diagnosis not present

## 2020-01-05 DIAGNOSIS — M79676 Pain in unspecified toe(s): Secondary | ICD-10-CM

## 2020-02-05 ENCOUNTER — Ambulatory Visit (INDEPENDENT_AMBULATORY_CARE_PROVIDER_SITE_OTHER): Payer: Medicaid Other | Admitting: Family Medicine

## 2020-02-11 NOTE — Progress Notes (Signed)
  Subjective:  Patient ID: Sara Cervantes, female    DOB: 2001-02-08,  MRN: [id number]  Chief Complaint  Patient presents with  . Nail Problem    Nail check. Pt states healing well from ingrown procedure, no longer draining/soaking. Pt states no concerns.    19 y.o. female presents for follow up of nail procedure. History confirmed with patient.   Objective:  Physical Exam: Ingrown nail avulsion site: overlying soft crust, no warmth, no drainage and no erythema Assessment:   1. Ingrown nail   2. Pain around toenail      Plan:  Patient was evaluated and treated and all questions answered.  S/p Ingrown Toenail Excision, right -Healing well without issue. -Discussed return precautions. -F/u PRN

## 2020-02-19 ENCOUNTER — Ambulatory Visit (INDEPENDENT_AMBULATORY_CARE_PROVIDER_SITE_OTHER): Payer: Medicaid Other | Admitting: Family Medicine

## 2020-02-19 ENCOUNTER — Ambulatory Visit
Admission: EM | Admit: 2020-02-19 | Discharge: 2020-02-19 | Disposition: A | Discharge status: Home / Self Care | Payer: Medicaid Other | Attending: Physician Assistant | Admitting: Physician Assistant

## 2020-02-19 ENCOUNTER — Encounter: Payer: Self-pay | Admitting: Emergency Medicine

## 2020-02-19 ENCOUNTER — Other Ambulatory Visit: Payer: Self-pay

## 2020-02-19 DIAGNOSIS — L509 Urticaria, unspecified: Secondary | ICD-10-CM

## 2020-02-19 MED ORDER — CETIRIZINE HCL 10 MG PO TABS: 10.0000 mg | ORAL_TABLET | Freq: Every day | ORAL | 0 refills | Status: DC

## 2020-02-19 MED ORDER — PREDNISONE 50 MG PO TABS: 50.0000 mg | ORAL_TABLET | Freq: Every day | ORAL | 0 refills | Status: DC

## 2020-02-19 NOTE — ED Notes (Signed)
Patient able to ambulate independently  

## 2020-02-19 NOTE — Discharge Instructions (Signed)
Start prednisone for symptoms. Zyrtec as directed. Ice compress for itching. If needed, can pick up over the counter systane eye drops to help with right eye itching or irritation. Monitor for any shortness of breath, swelling to the throat, go to the ED for further evaluation.

## 2020-02-19 NOTE — ED Triage Notes (Signed)
Pt presents to [facility] for assessment after she had a migraine (hx of same) last night, and states she took Ibuprofen and Excedrin migraine, put a wet washcloth on her face and fell asleep.  Pt states she woke up a few hours later to take a shower and her face was broken out in a rash.  Denies any difficulty breathing.

## 2020-02-19 NOTE — ED Provider Notes (Signed)
[facility]    CSN: [id number] Arrival date & time: 02/19/20  1407      History   Chief Complaint Chief Complaint  Patient presents with  . Allergic Reaction    HPI Sara Cervantes is a 19 y.o. female.   19 year old female comes in for rash to the face onset last night. States she had migraine episode last night that is not different from her normal migraines. Took ibuprofen and excedrin. She fell asleep with wet washcloth on her face. States after shoe woke up and took a shower, rash broke out on her face. States initially rash was noticed by boyfriend as she did not have any associated symptoms. However, rash has become more pruritic in nature. Denies any pain. Denies vesicles, burning. Rash surrounding right eye, and has had some tearing. Has had some blurry vision that could be caused by tearing. Denies photophobia. Denies contact lens use, does use glasses. Denies oral swelling, trouble breathing, tripoding, drooling trismus. No new hygiene product changes.      Past Medical History:  Diagnosis Date  . Hearing deficit, bilateral     Patient Active Problem List   Diagnosis Date Noted  . Bilateral hearing loss 02/06/2019  . Depression with anxiety 11/04/2018  . Environmental and seasonal allergies 11/04/2018  . Exercise-induced asthma 11/04/2018  . Migraine without aura and without status migrainosus, not intractable 11/04/2018  . Obesity, Class III, BMI 40-49.9 (morbid obesity) ([place]) 11/04/2018  . Irritable bowel syndrome 03/03/2018  . S/P laparoscopic cholecystectomy 02/06/2017    Past Surgical History:  Procedure Laterality Date  . CHOLECYSTECTOMY      OB History   No obstetric history on file.      Home Medications    Prior to Admission medications   Medication Sig Start Date End Date Taking? Authorizing Provider  albuterol (VENTOLIN HFA) 108 (90 Base) MCG/ACT inhaler SMARTSIG:2 Puff(s) By Mouth Every 6 Hours PRN 12/02/19   [provider]  cetirizine (ZYRTEC ALLERGY) 10 MG tablet Take 1 tablet (10 mg total) by mouth daily. 02/19/20   Tasia Catchings, Sybella Harnish V, PA-C  eletriptan (RELPAX) 20 MG tablet Take 20 mg by mouth 2 (two) times daily as needed. 12/02/19   [provider]  etonogestrel (NEXPLANON) 68 MG IMPL implant Inject into the skin.    [provider]  famotidine (PEPCID) 20 MG tablet Take 20 mg by mouth 2 (two) times daily.    [provider]  fluticasone (FLONASE) 50 MCG/ACT nasal spray Place into the nose. 11/30/19   [provider]  hydrOXYzine (ATARAX/VISTARIL) 10 MG tablet Take by mouth. 09/04/19   [provider]  lidocaine (XYLOCAINE) 2 % solution Use as directed 15 mLs in the mouth or throat as needed for mouth pain. 12/28/19   Hall-Potvin, [place], PA-C  metFORMIN (GLUCOPHAGE) 500 MG tablet TAKE 1/2 TABLET BY MOUTH TWICE A DAY WITH MEALS. 08/28/19   [provider]  metFORMIN (GLUCOPHAGE) 850 MG tablet Take 1 tablet by mouth every morning  with food x 1 week. Then increase to 1 tab twice daily with food if tolerated. 08/03/19   [provider]  predniSONE (DELTASONE) 50 MG tablet Take 1 tablet (50 mg total) by mouth daily with breakfast. 02/19/20   Tasia Catchings, Sire Poet V, PA-C  sertraline (ZOLOFT) 100 MG tablet Take 100 mg by mouth daily. 07/23/19   [provider]  TRULANCE 3 MG TABS Take 1 tablet by mouth daily. 06/14/19   [provider]  Family History Family History  Problem Relation Age of Onset  . Hypertension Mother   . Diabetes Father   . Hypertension Father   . Healthy Brother     Social History Social History   Tobacco Use  . Smoking status: Never Smoker  . Smokeless tobacco: Never Used  Substance Use Topics  . Alcohol use: Never  . Drug use: Never     Allergies   Penicillins   Review of Systems Review of Systems  Reason unable to perform ROS: See HPI as above.     Physical Exam Triage Vital Signs ED Triage Vitals   Enc Vitals Group     BP 02/19/20 1416 127/79     Pulse Rate 02/19/20 1416 79     Resp 02/19/20 1416 18     Temp 02/19/20 1416 97.9 F (36.6 C)     Temp Source 02/19/20 1416 Temporal     SpO2 02/19/20 1416 97 %     Weight --      Height --      Head Circumference --      Peak Flow --      Pain Score 02/19/20 1418 10     Pain Loc --      Pain Edu? --      Excl. in [place]? --    No data found.  Updated Vital Signs BP 127/79 (BP Location: Left Arm)   Pulse 79   Temp 97.9 F (36.6 C) (Temporal)   Resp 18   SpO2 97%   Physical Exam Constitutional:      General: She is not in acute distress.    Appearance: Normal appearance. She is well-developed. She is not toxic-appearing or diaphoretic.  HENT:     Head: Normocephalic and atraumatic.     Comments: Hives to the face, most surrounding right eye, forehead. Some to the cheeks, does cross midline. No vesicles. No erythema, warmth. No tenderness to palpation.  Eyes:     Extraocular Movements: Extraocular movements intact.     Conjunctiva/sclera: Conjunctivae normal.     Pupils: Pupils are equal, round, and reactive to light.     Comments: Fluorescein stain without uptake  Pulmonary:     Effort: Pulmonary effort is normal. No respiratory distress.     Comments: Speaking in full sentences without difficulty Musculoskeletal:     Cervical back: Normal range of motion and neck supple.  Skin:    General: Skin is warm and dry.  Neurological:     Mental Status: She is alert and oriented to person, place, and time.     UC Treatments / Results  Labs (all labs ordered are listed, but only abnormal results are displayed) Labs Reviewed - No data to display  EKG   Radiology No results found.  Procedures Procedures (including critical care time)  Medications Ordered in UC Medications - No data to display  Initial Impression / Assessment and Plan / UC Course  I have reviewed the triage vital signs and the nursing notes.   Pertinent labs & imaging results that were available during my care of the patient were reviewed by me and considered in my medical decision making (see chart for details).    No alarming signs on exam. Rash appearance without vesicles, crosses midline, low suspicion for herpes zoster. Given eye tearing, fluorescein stain performed without uptake. Will start prednisone and antihistamine for contact dermatitis/allergic dermatitis. Return precautions given. Patient expresses understanding and agrees to plan.  Final Clinical Impressions(s) /  UC Diagnoses   Final diagnoses:  Hives   ED Prescriptions    Medication Sig Dispense Auth. Provider   predniSONE (DELTASONE) 50 MG tablet Take 1 tablet (50 mg total) by mouth daily with breakfast. 5 tablet Danira Nylander V, PA-C   cetirizine (ZYRTEC ALLERGY) 10 MG tablet Take 1 tablet (10 mg total) by mouth daily. 15 tablet Belinda Fisher, PA-C     PDMP not reviewed this encounter.   Belinda Fisher, PA-C 02/19/20 1527

## 2020-03-25 ENCOUNTER — Encounter (HOSPITAL_COMMUNITY): Payer: Self-pay | Admitting: Emergency Medicine

## 2020-03-25 ENCOUNTER — Emergency Department (HOSPITAL_COMMUNITY)
Admission: EM | Admit: 2020-03-25 | Discharge: 2020-03-26 | Disposition: A | Discharge status: Home / Self Care | Payer: Medicaid Other | Attending: Emergency Medicine | Admitting: Emergency Medicine

## 2020-03-25 DIAGNOSIS — R109 Unspecified abdominal pain: Secondary | ICD-10-CM | POA: Insufficient documentation

## 2020-03-25 DIAGNOSIS — Z5321 Procedure and treatment not carried out due to patient leaving prior to being seen by health care provider: Secondary | ICD-10-CM | POA: Insufficient documentation

## 2020-03-25 LAB — URINALYSIS, ROUTINE W REFLEX MICROSCOPIC
Bacteria, UA: NONE SEEN
Bilirubin Urine: NEGATIVE
Glucose, UA: NEGATIVE mg/dL
Ketones, ur: NEGATIVE mg/dL
Leukocytes,Ua: NEGATIVE
Nitrite: NEGATIVE
Protein, ur: 30 mg/dL — AB
RBC / HPF: >50 RBC/hpf — ABNORMAL HIGH (ref 0–5)
Specific Gravity, Urine: 1.021 (ref 1.005–1.030)
pH: 6 (ref 5.0–8.0)

## 2020-03-25 NOTE — ED Triage Notes (Signed)
Pt c/o lower abd cramping onset last night bleeding post painful intercourse. Pt reports feeling bloated and tired x 2 days. Pt states she has a Nexplanon and normally does not have periods.

## 2020-03-25 NOTE — ED Notes (Signed)
Pt advised against leaving waiting room. Pt left waiting room. 

## 2020-03-26 ENCOUNTER — Other Ambulatory Visit: Payer: Self-pay

## 2020-03-26 ENCOUNTER — Encounter: Payer: Self-pay | Admitting: Emergency Medicine

## 2020-03-26 ENCOUNTER — Ambulatory Visit
Admission: EM | Admit: 2020-03-26 | Discharge: 2020-03-26 | Disposition: A | Discharge status: Home / Self Care | Payer: Medicaid Other | Attending: Internal Medicine | Admitting: Internal Medicine

## 2020-03-26 DIAGNOSIS — R1030 Lower abdominal pain, unspecified: Secondary | ICD-10-CM | POA: Diagnosis not present

## 2020-03-26 DIAGNOSIS — Z975 Presence of (intrauterine) contraceptive device: Secondary | ICD-10-CM | POA: Insufficient documentation

## 2020-03-26 DIAGNOSIS — N921 Excessive and frequent menstruation with irregular cycle: Secondary | ICD-10-CM | POA: Insufficient documentation

## 2020-03-26 LAB — POCT URINALYSIS DIP (MANUAL ENTRY)
Bilirubin, UA: NEGATIVE
Glucose, UA: NEGATIVE mg/dL
Ketones, POC UA: NEGATIVE mg/dL
Leukocytes, UA: NEGATIVE
Nitrite, UA: NEGATIVE
Protein Ur, POC: 30 mg/dL — AB
Spec Grav, UA: >=1.03 — AB (ref 1.010–1.025)
Urobilinogen, UA: 0.2 E.U./dL
pH, UA: 6 (ref 5.0–8.0)

## 2020-03-26 LAB — POCT URINE PREGNANCY: Preg Test, Ur: NEGATIVE

## 2020-03-26 MED ORDER — MELOXICAM 7.5 MG PO TABS: 7.5000 mg | ORAL_TABLET | Freq: Every day | ORAL | 0 refills | Status: DC

## 2020-03-26 NOTE — Discharge Instructions (Signed)

## 2020-03-26 NOTE — ED Provider Notes (Signed)
[facility]    CSN: [id number] Arrival date & time: 03/26/20  1126      History   Chief Complaint Chief Complaint  Patient presents with  . Abdominal Pain    HPI Sara Cervantes is a 19 y.o. female with history of obesity, IBS presenting for 3-day course of lower abdominal pain.  Also endorsing fatigue and bloating.  Noticed odor to urine the other day as well as vaginal discharge, spotting.  States vaginal discharge is intermittent.  Patient did note dyspareunia x1 week.  Denies history of STI.  Has Nexplanon in place.  Currently sexually active with 1 female partner, not routinely using condoms.  Denies nausea, vomiting, fever, urinary frequency or urgency, hematuria.  No history of kidney stones or pyelonephritis.  No change in bowel habit.  Last BM was last night: No blood or melena.   Past Medical History:  Diagnosis Date  . Hearing deficit, bilateral     Patient Active Problem List   Diagnosis Date Noted  . Bilateral hearing loss 02/06/2019  . Depression with anxiety 11/04/2018  . Environmental and seasonal allergies 11/04/2018  . Exercise-induced asthma 11/04/2018  . Migraine without aura and without status migrainosus, not intractable 11/04/2018  . Obesity, Class III, BMI 40-49.9 (morbid obesity) ([place]) 11/04/2018  . Irritable bowel syndrome 03/03/2018  . S/P laparoscopic cholecystectomy 02/06/2017    Past Surgical History:  Procedure Laterality Date  . CHOLECYSTECTOMY      OB History   No obstetric history on file.      Home Medications    Prior to Admission medications   Medication Sig Start Date End Date Taking? Authorizing Provider  hydrOXYzine (ATARAX/VISTARIL) 10 MG tablet Take by mouth. 09/04/19  Yes [provider]  metFORMIN (GLUCOPHAGE) 850 MG tablet Take 1 tablet by mouth every morning  with food x 1 week. Then increase to 1 tab twice daily with food if tolerated. 08/03/19  Yes [provider]  sertraline (ZOLOFT) 100  MG tablet Take 100 mg by mouth daily. 07/23/19  Yes [provider]  TRULANCE 3 MG TABS Take 1 tablet by mouth daily. 06/14/19  Yes [provider]  albuterol (VENTOLIN HFA) 108 (90 Base) MCG/ACT inhaler SMARTSIG:2 Puff(s) By Mouth Every 6 Hours PRN 12/02/19   [provider]  cetirizine (ZYRTEC ALLERGY) 10 MG tablet Take 1 tablet (10 mg total) by mouth daily. 02/19/20   Tasia Catchings, Amy V, PA-C  eletriptan (RELPAX) 20 MG tablet Take 20 mg by mouth 2 (two) times daily as needed. 12/02/19   [provider]  etonogestrel (NEXPLANON) 68 MG IMPL implant Inject into the skin.    [provider]  fluticasone (FLONASE) 50 MCG/ACT nasal spray Place into the nose. 11/30/19   [provider]  meloxicam (MOBIC) 7.5 MG tablet Take 1 tablet (7.5 mg total) by mouth daily. 03/26/20   Hall-Potvin, [place], PA-C  metFORMIN (GLUCOPHAGE) 500 MG tablet TAKE 1/2 TABLET BY MOUTH TWICE A DAY WITH MEALS. 08/28/19   [provider]  famotidine (PEPCID) 20 MG tablet Take 20 mg by mouth 2 (two) times daily.  03/26/20  [provider]    Family History Family History  Problem Relation Age of Onset  . Hypertension Mother   . Diabetes Father   . Hypertension Father   . Healthy Brother     Social History Social History   Tobacco Use  . Smoking status: Never Smoker  . Smokeless tobacco: Never Used  Substance Use Topics  .  Alcohol use: Never  . Drug use: Never     Allergies   Penicillins   Review of Systems As per HPI   Physical Exam Triage Vital Signs ED Triage Vitals  Enc Vitals Group     BP      Pulse      Resp      Temp      Temp src      SpO2      Weight      Height      Head Circumference      Peak Flow      Pain Score      Pain Loc      Pain Edu?      Excl. in GC?    No data found.  Updated Vital Signs BP 118/66 (BP Location: Left Arm) Comment (BP Location): large cuff  Pulse 91   Temp 98.3 F (36.8 C) (Oral)   Resp 20    SpO2 96%   Visual Acuity Right Eye Distance:   Left Eye Distance:   Bilateral Distance:    Right Eye Near:   Left Eye Near:    Bilateral Near:     Physical Exam Constitutional:      General: She is not in acute distress.    Appearance: She is obese. She is not ill-appearing.  HENT:     Head: Normocephalic and atraumatic.  Eyes:     General: No scleral icterus.    Pupils: Pupils are equal, round, and reactive to light.  Cardiovascular:     Rate and Rhythm: Normal rate.  Pulmonary:     Effort: Pulmonary effort is normal.  Abdominal:     General: Bowel sounds are normal. There is no distension.     Palpations: Abdomen is soft.     Tenderness: There is abdominal tenderness in the suprapubic area. There is no right CVA tenderness, left CVA tenderness, guarding or rebound.  Genitourinary:    Vagina: No signs of injury and foreign body. Bleeding present. No vaginal discharge or tenderness.     Cervix: No cervical motion tenderness.     Uterus: Not tender.      Adnexa:        Right: No tenderness.         Left: No tenderness.       Comments: Dark/brown blood without clots in canal Skin:    Coloration: Skin is not jaundiced or pale.  Neurological:     Mental Status: She is alert and oriented to person, place, and time.      UC Treatments / Results  Labs (all labs ordered are listed, but only abnormal results are displayed) Labs Reviewed  POCT URINALYSIS DIP (MANUAL ENTRY) - Abnormal; Notable for the following components:      Result Value   Spec Grav, UA >=1.030 (*)    Blood, UA large (*)    Protein Ur, POC =30 (*)    All other components within normal limits  POCT URINE PREGNANCY  CERVICOVAGINAL ANCILLARY ONLY    EKG   Radiology No results found.  Procedures Procedures (including critical care time)  Medications Ordered in UC Medications - No data to display  Initial Impression / Assessment and Plan / UC Course  I have reviewed the triage vital signs  and the nursing notes.  Pertinent labs & imaging results that were available during my care of the patient were reviewed by me and considered in my medical decision making (see  chart for details).     Patient febrile, nontoxic in office today.  Urine pregnancy negative, urine dipstick significant for large blood, 30 protein, elevated specific gravity.  Pelvic exam showing dark blood in vaginal vault without clotting or evidence of trauma.  Low concern for active bleeding.  Patient overall tolerates exam well and there is no CMT.  Will await cytology and treat if indicated.  Likely breakthrough bleeding and cramping on Nexplanon as patient has not had cycle in a long time.  Will have patient follow-up with PCP/OB/GYN for further evaluation/management if this persists.  Will trial Mobic for pain.  ER return precautions discussed, patient verbalized understanding and is agreeable to plan. Final Clinical Impressions(s) / UC Diagnoses   Final diagnoses:  Lower abdominal pain  Breakthrough bleeding on Nexplanon     Discharge Instructions     Testing for chlamydia, gonorrhea, trichomonas is pending: please look for these results on the MyChart app/website.  We will notify you if you are positive and outline treatment at that time.  Important to avoid all forms of sexual intercourse (oral, vaginal, anal) with any/all partners for the next 7 days to avoid spreading/reinfecting. Any/all sexual partners should be notified of testing/treatment today.  Return for persistent/worsening symptoms or if you develop fever, abdominal or pelvic pain, blood in your urine, or are re-exposed to an STI.    ED Prescriptions    Medication Sig Dispense Auth. Provider   meloxicam (MOBIC) 7.5 MG tablet Take 1 tablet (7.5 mg total) by mouth daily. 14 tablet Hall-Potvin, [place], PA-C     PDMP not reviewed this encounter.   Hall-Potvin, [place], [place] 03/26/20 1548

## 2020-03-26 NOTE — ED Triage Notes (Signed)
onset 03/24/2020:  Patient reports very sharp pain in lower abdomen, feeling tired and bloated.  Odor to urine.  Last BM was last night and normal per patient.  Reports vaginal discharge yesterday.  No vomiting and no diarrhea.  Patient reports pain with intercourse that started one week ago

## 2020-03-28 LAB — CERVICOVAGINAL ANCILLARY ONLY
Bacterial Vaginitis (gardnerella): NEGATIVE
Candida Glabrata: NEGATIVE
Candida Vaginitis: NEGATIVE
Chlamydia: NEGATIVE
Comment: NEGATIVE
Comment: NEGATIVE
Comment: NEGATIVE
Comment: NEGATIVE
Comment: NEGATIVE
Comment: NORMAL
Neisseria Gonorrhea: NEGATIVE
Trichomonas: NEGATIVE

## 2020-04-08 ENCOUNTER — Emergency Department (HOSPITAL_BASED_OUTPATIENT_CLINIC_OR_DEPARTMENT_OTHER)
Admission: EM | Admit: 2020-04-08 | Discharge: 2020-04-08 | Disposition: A | Discharge status: Home / Self Care | Payer: Medicaid Other | Attending: Emergency Medicine | Admitting: Emergency Medicine

## 2020-04-08 ENCOUNTER — Encounter (HOSPITAL_BASED_OUTPATIENT_CLINIC_OR_DEPARTMENT_OTHER): Payer: Self-pay | Admitting: *Deleted

## 2020-04-08 ENCOUNTER — Emergency Department (HOSPITAL_BASED_OUTPATIENT_CLINIC_OR_DEPARTMENT_OTHER): Payer: Medicaid Other

## 2020-04-08 ENCOUNTER — Other Ambulatory Visit: Payer: Self-pay

## 2020-04-08 DIAGNOSIS — Z79899 Other long term (current) drug therapy: Secondary | ICD-10-CM | POA: Insufficient documentation

## 2020-04-08 DIAGNOSIS — M79602 Pain in left arm: Secondary | ICD-10-CM | POA: Diagnosis not present

## 2020-04-08 DIAGNOSIS — Z7984 Long term (current) use of oral hypoglycemic drugs: Secondary | ICD-10-CM | POA: Diagnosis not present

## 2020-04-08 DIAGNOSIS — J45909 Unspecified asthma, uncomplicated: Secondary | ICD-10-CM | POA: Insufficient documentation

## 2020-04-08 DIAGNOSIS — Z880 Allergy status to penicillin: Secondary | ICD-10-CM | POA: Insufficient documentation

## 2020-04-08 DIAGNOSIS — Z793 Long term (current) use of hormonal contraceptives: Secondary | ICD-10-CM | POA: Diagnosis not present

## 2020-04-08 DIAGNOSIS — Z975 Presence of (intrauterine) contraceptive device: Secondary | ICD-10-CM

## 2020-04-08 HISTORY — DX: Unspecified asthma, uncomplicated: J45.909

## 2020-04-08 NOTE — Discharge Instructions (Addendum)
Please follow up with [facility] to discuss removal of nexplanon  Continue taking Ibuprofen and Tylenol as needed for pain

## 2020-04-08 NOTE — ED Triage Notes (Signed)
Feels like birth control implant in left arm is moving  Arm is painful

## 2020-04-08 NOTE — ED Provider Notes (Signed)
[facility] EMERGENCY DEPARTMENT Provider Note   CSN: [id number] Arrival date & time: 04/08/20  1503     History Chief Complaint  Patient presents with  . Arm Pain    Sara Cervantes is a 19 y.o. female who presents to the ED today with complaint of gradual onset, constant, worsening, left upper arm pain x 2 weeks.  Patient reports that she has Nexplanon in her arm and feels like it is moving.  She states that last night she felt like someone took a knife to the Nexplanon and was grinding it up and down her arm.  Has been taking over-the-counter ibuprofen without relief.  Mentions that she had her Nexplanon placed in 2019 and only started having issues 2 weeks ago.  Does not have a OB/GYN in the area and reports that the person who placed it is 3 hours away and therefore she cannot follow-up with them. Pt does mention she recently took a 12 hour train ride to [facility] prior to her pain beginning. Denies fevers, chills, chest pain, SOB, or any other associated symptoms.   The history is provided by the patient and medical records.       Past Medical History:  Diagnosis Date  . Asthma   . Hearing deficit, bilateral     Patient Active Problem List   Diagnosis Date Noted  . Bilateral hearing loss 02/06/2019  . Depression with anxiety 11/04/2018  . Environmental and seasonal allergies 11/04/2018  . Exercise-induced asthma 11/04/2018  . Migraine without aura and without status migrainosus, not intractable 11/04/2018  . Obesity, Class III, BMI 40-49.9 (morbid obesity) ([facility]) 11/04/2018  . Irritable bowel syndrome 03/03/2018  . S/P laparoscopic cholecystectomy 02/06/2017    Past Surgical History:  Procedure Laterality Date  . CHOLECYSTECTOMY       OB History   No obstetric history on file.     Family History  Problem Relation Age of Onset  . Hypertension Mother   . Diabetes Father   . Hypertension Father   . Healthy Brother     Social History   Tobacco Use  .  Smoking status: Never Smoker  . Smokeless tobacco: Never Used  Substance Use Topics  . Alcohol use: Never  . Drug use: Never    Home Medications Prior to Admission medications   Medication Sig Start Date End Date Taking? Authorizing Provider  albuterol (VENTOLIN HFA) 108 (90 Base) MCG/ACT inhaler SMARTSIG:2 Puff(s) By Mouth Every 6 Hours PRN 12/02/19   [provider]  cetirizine (ZYRTEC ALLERGY) 10 MG tablet Take 1 tablet (10 mg total) by mouth daily. 02/19/20   Cathie Hoops, Amy V, PA-C  eletriptan (RELPAX) 20 MG tablet Take 20 mg by mouth 2 (two) times daily as needed. 12/02/19   [provider]  etonogestrel (NEXPLANON) 68 MG IMPL implant Inject into the skin.    [provider]  fluticasone (FLONASE) 50 MCG/ACT nasal spray Place into the nose. 11/30/19   [provider]  hydrOXYzine (ATARAX/VISTARIL) 10 MG tablet Take by mouth. 09/04/19   [provider]  meloxicam (MOBIC) 7.5 MG tablet Take 1 tablet (7.5 mg total) by mouth daily. 03/26/20   Hall-Potvin, [place], PA-C  metFORMIN (GLUCOPHAGE) 500 MG tablet TAKE 1/2 TABLET BY MOUTH TWICE A DAY WITH MEALS. 08/28/19   [provider]  metFORMIN (GLUCOPHAGE) 850 MG tablet Take 1 tablet by mouth every morning  with food x 1 week. Then increase to 1 tab twice daily with food if tolerated. 08/03/19  [provider]  sertraline (ZOLOFT) 100 MG tablet Take 100 mg by mouth daily. 07/23/19   [provider]  TRULANCE 3 MG TABS Take 1 tablet by mouth daily. 06/14/19   [provider]  famotidine (PEPCID) 20 MG tablet Take 20 mg by mouth 2 (two) times daily.  03/26/20  [provider]    Allergies    Penicillins  Review of Systems   Review of Systems  Constitutional: Negative for chills and fever.  Respiratory: Negative for shortness of breath.   Cardiovascular: Negative for chest pain and leg swelling.  Musculoskeletal: Positive for arthralgias.  All other systems  reviewed and are negative.   Physical Exam Updated Vital Signs BP 121/74 (BP Location: Right Arm)   Pulse 73   Temp 98.9 F (37.2 C) (Oral)   Resp 18   Ht 5\' 5"  (1.651 m)   Wt 131.5 kg   SpO2 100%   BMI 48.26 kg/m   Physical Exam Vitals and nursing note reviewed.  Constitutional:      Appearance: She is obese. She is not ill-appearing or diaphoretic.  HENT:     Head: Normocephalic and atraumatic.  Eyes:     Conjunctiva/sclera: Conjunctivae normal.  Cardiovascular:     Rate and Rhythm: Normal rate and regular rhythm.  Pulmonary:     Effort: Pulmonary effort is normal.     Breath sounds: Normal breath sounds.  Abdominal:     Palpations: Abdomen is soft.     Tenderness: There is no abdominal tenderness.  Musculoskeletal:     Cervical back: Neck supple.     Comments: Nexplanon appears to be in place. No overlying erythema, edema, increased warmth, discharge. Mild TTP to the nexplanon itself. + TTP diffusely to left upper arm. ROM intact to shoulder, elbow, wrist. 2+ radial pulse.   Skin:    General: Skin is warm and dry.  Neurological:     Mental Status: She is alert.     ED Results / Procedures / Treatments   Labs (all labs ordered are listed, but only abnormal results are displayed) Labs Reviewed - No data to display  EKG None  Radiology Venous Img Upper Left (DVT Study)  Result Date: 04/08/2020 CLINICAL DATA:  Arm pain EXAM: LEFT UPPER EXTREMITY VENOUS DOPPLER ULTRASOUND TECHNIQUE: Gray-scale sonography with graded compression, as well as color Doppler and duplex ultrasound were performed to evaluate the upper extremity deep venous system from the level of the subclavian vein and including the jugular, axillary, basilic, radial, ulnar and upper cephalic vein. Spectral Doppler was utilized to evaluate flow at rest and with distal augmentation maneuvers. COMPARISON:  None. FINDINGS: Contralateral Subclavian Vein: Respiratory phasicity is normal and symmetric with  the symptomatic side. No evidence of thrombus. Normal compressibility. Internal Jugular Vein: No evidence of thrombus. Normal compressibility, respiratory phasicity and response to augmentation. Subclavian Vein: No evidence of thrombus. Normal compressibility, respiratory phasicity and response to augmentation. Axillary Vein: No evidence of thrombus. Normal compressibility, respiratory phasicity and response to augmentation. Cephalic Vein: No evidence of thrombus. Normal compressibility, respiratory phasicity and response to augmentation. Basilic Vein: No evidence of thrombus. Normal compressibility, respiratory phasicity and response to augmentation. Brachial Veins: No evidence of thrombus. Normal compressibility, respiratory phasicity and response to augmentation. Radial Veins: No evidence of thrombus. Normal compressibility, respiratory phasicity and response to augmentation. Ulnar Veins: No evidence of thrombus. Normal compressibility, respiratory phasicity and response to augmentation. Other Findings: Incidentally noted is an implantable contraceptive device in the  left upper extremity. IMPRESSION: No evidence of DVT within the left upper extremity. Electronically Signed   By: Constance Holster M.D.   On: 04/08/2020 18:17    Procedures Procedures (including critical care time)  Medications Ordered in ED Medications - No data to display  ED Course  I have reviewed the triage vital signs and the nursing notes.  Pertinent labs & imaging results that were available during my care of the patient were reviewed by me and considered in my medical decision making (see chart for details).    MDM Rules/Calculators/A&P                      19 year old female presents the ED today complaining of pain to her left upper arm for the past 2 weeks.  Reports that she has specific pain to her Nexplanon which was inserted in 2019.  Does also mention that she recently had a 12-hour train ride to [place] 2 weeks  ago prior to her pain beginning.  No chest pain or shortness of breath.  On arrival to the ED patient is afebrile, nontachycardic nontachypneic.  She appears to be in no acute distress.  She does have a diffuse tenderness palpation to her left upper arm specifically around the Nexplanon site itself.  No signs of infection around the site.  Obtain ultrasound study today to rule out DVT given patient's recent travel.  We will have her follow-up with OB/GYN to discuss Nexplanon removal.  Patient reports that the OB/GYN that placed it is 3 hours away and she is unable to follow-up with them.  We will give her name of women's clinic care.  DVT study negative. Will have pt follow up outpatient with OBGYN to discuss other alternatives to birth control/nexplanon removal. Have advised Ibuprofen and Tylenol PRN for pain. Pt is in agreement with plan and stable for discharge home.   This note was prepared using Dragon voice recognition software and may include unintentional dictation errors due to the inherent limitations of voice recognition software.  Final Clinical Impression(s) / ED Diagnoses Final diagnoses:  Left arm pain  Nexplanon in place    Rx / DC Orders ED Discharge Orders    None       Discharge Instructions     Please follow up with [facility] [place] to discuss removal of nexplanon  Continue taking Ibuprofen and Tylenol as needed for pain       Eustaquio Maize, PA-C 04/08/20 1854    Margette Fast, MD 04/09/20 1337

## 2020-04-10 ENCOUNTER — Other Ambulatory Visit: Payer: Self-pay

## 2020-04-10 ENCOUNTER — Ambulatory Visit (INDEPENDENT_AMBULATORY_CARE_PROVIDER_SITE_OTHER): Payer: Medicaid Other | Admitting: Podiatry

## 2020-04-10 ENCOUNTER — Encounter: Payer: Self-pay | Admitting: Podiatry

## 2020-04-10 DIAGNOSIS — L600 Ingrowing nail: Secondary | ICD-10-CM | POA: Diagnosis not present

## 2020-04-10 DIAGNOSIS — L603 Nail dystrophy: Secondary | ICD-10-CM

## 2020-04-11 ENCOUNTER — Encounter: Payer: Self-pay | Admitting: Podiatry

## 2020-04-11 NOTE — Progress Notes (Signed)
Subjective:  Patient ID: Sara Cervantes, female    DOB: [birth year],  MRN: [id number]  Chief Complaint  Patient presents with  . Nail Problem    pt is here for a possible ingrown of the right big toenail    19 y.o. female presents with the above complaint.  Patient presents with left hallux medial ingrown.  Patient states been going for 2 to 3 weeks.  There is pain on palpation.  Patient states is been oozing and some purulent drainage that was expressed.  There is no redness surrounding the nail.  It is painful when applying pressure and while ambulating.  She denies any other acute complaints.  Is due she denies seeing anyone else for this.  She would like to know if this could be removed down.  Her pain scale 6 out of 10.  And is dull achy in nature.   Review of Systems: Negative except as noted in the HPI. Denies N/V/F/Ch.  Past Medical History:  Diagnosis Date  . Asthma   . Hearing deficit, bilateral     Current Outpatient Medications:  .  albuterol (VENTOLIN HFA) 108 (90 Base) MCG/ACT inhaler, SMARTSIG:2 Puff(s) By Mouth Every 6 Hours PRN, Disp: , Rfl:  .  cetirizine (ZYRTEC ALLERGY) 10 MG tablet, Take 1 tablet (10 mg total) by mouth daily., Disp: 15 tablet, Rfl: 0 .  eletriptan (RELPAX) 20 MG tablet, Take 20 mg by mouth 2 (two) times daily as needed., Disp: , Rfl:  .  etonogestrel (NEXPLANON) 68 MG IMPL implant, Inject into the skin., Disp: , Rfl:  .  fluticasone (FLONASE) 50 MCG/ACT nasal spray, Place into the nose., Disp: , Rfl:  .  hydrOXYzine (ATARAX/VISTARIL) 10 MG tablet, Take by mouth., Disp: , Rfl:  .  ID NOW COVID-19 KIT, See admin instructions. for testing, Disp: , Rfl:  .  meloxicam (MOBIC) 7.5 MG tablet, Take 1 tablet (7.5 mg total) by mouth daily., Disp: 14 tablet, Rfl: 0 .  metFORMIN (GLUCOPHAGE) 500 MG tablet, TAKE 1/2 TABLET BY MOUTH TWICE A DAY WITH MEALS., Disp: , Rfl:  .  metFORMIN (GLUCOPHAGE) 850 MG tablet, Take 1 tablet by mouth every morning  with food x 1  week. Then increase to 1 tab twice daily with food if tolerated., Disp: , Rfl:  .  sertraline (ZOLOFT) 100 MG tablet, Take 100 mg by mouth daily., Disp: , Rfl:  .  TRULANCE 3 MG TABS, Take 1 tablet by mouth daily., Disp: , Rfl:   Social History   Tobacco Use  Smoking Status Never Smoker  Smokeless Tobacco Never Used    Allergies  Allergen Reactions  . Penicillins Hives   Objective:  There were no vitals filed for this visit. There is no height or weight on file to calculate BMI. Constitutional Well developed. Well nourished.  Vascular Dorsalis pedis pulses palpable bilaterally. Posterior tibial pulses palpable bilaterally. Capillary refill normal to all digits.  No cyanosis or clubbing noted. Pedal hair growth normal.  Neurologic Normal speech. Oriented to person, place, and time. Epicritic sensation to light touch grossly present bilaterally.  Dermatologic Painful ingrowing nail at medial nail borders of the hallux nail left. No other open wounds. No skin lesions.  Orthopedic: Normal joint ROM without pain or crepitus bilaterally. No visible deformities. No bony tenderness.   Radiographs: None Assessment:   1. Ingrown left big toenail   2. Nail dystrophy    Plan:  Patient was evaluated and treated and all questions answered.  Ingrown Nail,  left/nail dystrophy -Patient elects to proceed with minor surgery to remove ingrown toenail removal today. Consent reviewed and signed by patient. -Ingrown nail excised. See procedure note. -Educated on post-procedure care including soaking. Written instructions provided and reviewed. -Patient to follow up in 2 weeks for nail check.  Procedure: Excision of Ingrown Toenail Location: Left 1st toe medial nail borders. Anesthesia: Lidocaine 1% plain; 1.5 mL and Marcaine 0.5% plain; 1.5 mL, digital block. Skin Prep: Betadine. Dressing: Silvadene; telfa; dry, sterile, compression dressing. Technique: Following skin prep, the toe  was exsanguinated and a tourniquet was secured at the base of the toe. The affected nail border was freed, split with a nail splitter, and excised. Chemical matrixectomy was then performed with phenol and irrigated out with alcohol. The tourniquet was then removed and sterile dressing applied. Disposition: Patient tolerated procedure well. Patient to return in 2 weeks for follow-up.   No follow-ups on file.

## 2020-04-26 ENCOUNTER — Encounter: Payer: Self-pay | Admitting: General Practice

## 2020-04-26 ENCOUNTER — Ambulatory Visit (INDEPENDENT_AMBULATORY_CARE_PROVIDER_SITE_OTHER): Payer: Medicaid Other

## 2020-04-26 ENCOUNTER — Other Ambulatory Visit: Payer: Self-pay

## 2020-04-26 VITALS — BP 109/70 | HR 79 | Temp 98.0°F | Ht 65.0 in | Wt 292.6 lb

## 2020-04-26 DIAGNOSIS — Z0000 Encounter for general adult medical examination without abnormal findings: Secondary | ICD-10-CM

## 2020-04-26 DIAGNOSIS — Z3046 Encounter for surveillance of implantable subdermal contraceptive: Secondary | ICD-10-CM | POA: Diagnosis not present

## 2020-04-26 DIAGNOSIS — Z30011 Encounter for initial prescription of contraceptive pills: Secondary | ICD-10-CM | POA: Diagnosis not present

## 2020-04-26 MED ORDER — NORETHIN-ETH ESTRAD-FE BIPHAS 1 MG-10 MCG / 10 MCG PO TABS
1.0000 | ORAL_TABLET | Freq: Every day | ORAL | 3 refills | Status: DC
Start: 2020-04-26 — End: 2020-08-13

## 2020-04-26 NOTE — Progress Notes (Signed)
GYNECOLOGY OFFICE VISIT NOTE  History:  Sara Cervantes is a 19 y.o. [phone] here today for new patient exam and Nexplanon removal. She denies any abnormal vaginal discharge, bleeding, pelvic pain or other concerns. She reports her last sexual encounter was ~ 1 week ago with condom usage.    Patient reports one partner in the last year and denies pain or discomfort during sexual activity.  Patient denies vaginal concerns including bleeding or discharge.  She reports some occassional pelvic pain.  She declines STD testing stating that " I just had some done in a month or two ago."    Patient states she does not have a PCP, but is on multiple medications.  Metformin (850mg  BID), Trulance 3mg  Daily, Atarax (10mg  Daily), and Zoloft 100mg  Daily. She reports that "working is my exercise."  She endorses that balanced nutritional intake and states "I eat the three meals recommended and portion out my meals, for sure." She further states "I have cut down fast foods for at least a month and a half or so."   Patient reports she is at [facility] [employer] and is Insurance underwriter. 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  She states that she lives with her best friend and her family and feels safe in this environment.  Patient denies DV/A.  She endorses good social support.  Past Medical History:  Diagnosis Date  . Asthma   . Diabetes mellitus without complication ([place])   . Hearing deficit, bilateral     Past Surgical History:  Procedure Laterality Date  . CHOLECYSTECTOMY      The following portions of the patient's history were reviewed and updated as appropriate: allergies, current medications, past family history, past medical history, past social history, past surgical history and problem list.   Health Maintenance:  No pap on file due to age.  No mammogram d/t age.   Review of Systems:  General ROS: negative  Genito-Urinary ROS: negative  Objective:  Vitals: BP 109/70 (BP  Location: Right Arm, Patient Position: Sitting, Cuff Size: Large)   Pulse 79   Temp 98 F (36.7 C) (Oral)   Ht 5\' 5"  (1.651 m)   Wt 292 lb 9.6 oz (132.7 kg)   BMI 48.69 kg/m   Physical Exam  Constitutional: She is oriented to person, place, and time and well-developed, well-nourished, and in no distress. No distress.  Obese  HENT:  Head: Normocephalic and atraumatic.  Eyes: Conjunctivae are normal.  Neck: No tracheal deviation present. No thyroid mass and no thyromegaly present.  Cardiovascular: Normal rate, regular rhythm and normal heart sounds.  Pulmonary/Chest: Effort normal and breath sounds normal. No respiratory distress.  Abdominal: Soft. Bowel sounds are normal. There is no abdominal tenderness.  Musculoskeletal:        General: Normal range of motion.     Cervical back: Normal range of motion.  Neurological: She is alert and oriented to person, place, and time.  Skin: Skin is warm and dry.  Psychiatric: Affect normal.     Nexplanon Removal Procedure:  Darnelle Catalan requests removal of Nexplanon. Patient also understands that removal can result in blood loss, infection at removal site, and pain.  Patient verbalizes understanding for all risks as stated above and in the consent and desires to proceed with removal.   Patient identified, informed consent performed, consent signed. Appropriate time out taken.   Nexplanon site identified; left arm.  Area prepped in usual sterile fashon. 0.5 ml of 1% lidocaine  with epi was used to anesthetize the area at the distal end of the implant. A small  incision was made perpendicular to the distal tip of the implant.  The Nexplanon rod was visualized within the sheath. The sheath was gently removed with the scalpel, resulting in the Nexplanon ejecting from the site without difficulty. There was minimal blood loss and no complications.  Steri-strips were applied over the incision.  A band aid was applied for additional pressure.  The  patient tolerated the procedure well and was given post procedure instructions to include: *Remove of pressure dressing and band-aid in 12-24 hours. *Remove of steri-strips after no more than 7 days. *Condom usage encouraged for avoidance for STIs. *Tylenol or ibuprofen for insertion pain/discomfort. *Call for any other questions, concerns, or complications.   Assessment & Plan:  19 year old Well Woman Exam Nexplanon Removal Initiation of Oral Contraception  -Educated on AHA exercise recommendations of 30 minutes of moderate to vigorous activity at least 5x/week. -Discussed how initiation of exercise can improve overall health including weight loss, normalization of endocrine state, and improvement in mood.  -Encouraged to continue balanced nutritional intake. -Will start on Lo-Lo Estrogen variation and monitor for 3 months. -Reviewed signs and symptoms that require immediate evaluation or need to contact office. -Patient to have blood pressure check in 3 months as nurse only visit. -If blood pressure normotensive and patient happy with method, nurse to send refill. -If any concerns or abnormal blood pressures, patient to discontinue method and will be scheduled for virtual visit at next available. -Patient without questions or concerns and agreeable with plan. -Rx sent to pharmacy on file with 3 refills.   Total face-to-face time with patient: 20 minutes   Cherre Robins MSN, CNM 04/26/2020

## 2020-04-26 NOTE — Patient Instructions (Signed)
Oral Contraception Use Oral contraceptive pills (OCPs) are medicines that you take to prevent pregnancy. OCPs work by:  Preventing the ovaries from releasing eggs.  Thickening mucus in the lower part of the uterus (cervix), which prevents sperm from entering the uterus.  Thinning the lining of the uterus (endometrium), which prevents a fertilized egg from attaching to the endometrium. OCPs are highly effective when taken exactly as prescribed. However, OCPs do not prevent sexually transmitted infections (STIs). Safe sex practices, such as using condoms while on an OCP, can help prevent STIs. Before taking OCPs, you may have a physical exam, blood test, and Pap test. A Pap test involves taking a sample of cells from your cervix to check for cancer. Discuss with your health care provider the possible side effects of the OCP you may be prescribed. When you start an OCP, be aware that it can take 2-3 months for your body to adjust to changes in hormone levels. How to take oral contraceptive pills Follow instructions from your health care provider about how to start taking your first cycle of OCPs. Your health care provider may recommend that you:  Start the pill on day 1 of your menstrual period. If you start at this time, you will not need any backup form of birth control (contraception), such as condoms.  Start the pill on the first Sunday after your menstrual period or on the day you get your prescription. In these cases, you will need to use backup contraception for the first week.  Start the pill at any time of your cycle. ? If you take the pill within 5 days of the start of your period, you will not need a backup form of contraception. ? If you start at any other time of your menstrual cycle, you will need to use another form of contraception for 7 days. If your OCP is the type called a minipill, it will protect you from pregnancy after taking it for 2 days (48 hours), and you can stop using  backup contraception after that time. After you have started taking OCPs:  If you forget to take 1 pill, take it as soon as you remember. Take the next pill at the regular time.  If you miss 2 or more pills, call your health care provider. Different pills have different instructions for missed doses. Use backup birth control until your next menstrual period starts.  If you use a 28-day pack that contains inactive pills and you miss 1 of the last 7 pills (pills with no hormones), throw away the rest of the non-hormone pills and start a new pill pack. No matter which day you start the OCP, you will always start a new pack on that same day of the week. Have an extra pack of OCPs and a backup contraceptive method available in case you miss some pills or lose your OCP pack. Follow these instructions at home:  Do not use any products that contain nicotine or tobacco, such as cigarettes and e-cigarettes. If you need help quitting, ask your health care provider.  Always use a condom to protect against STIs. OCPs do not protect against STIs.  Use a calendar to mark the days of your menstrual period.  Read the information and directions that came with your OCP. Talk to your health care provider if you have questions. Contact a health care provider if:  You develop nausea and vomiting.  You have abnormal vaginal discharge or bleeding.  You develop a rash.    You miss your menstrual period. Depending on the type of OCP you are taking, this may be a sign of pregnancy. Ask your health care provider for more information.  You are losing your hair.  You need treatment for mood swings or depression.  You get dizzy when taking the OCP.  You develop acne after taking the OCP.  You become pregnant or think you may be pregnant.  You have diarrhea, constipation, and abdominal pain or cramps.  You miss 2 or more pills. Get help right away if:  You develop chest pain.  You develop shortness of  breath.  You have an uncontrolled or severe headache.  You develop numbness or slurred speech.  You develop visual or speech problems.  You develop pain, redness, and swelling in your legs.  You develop weakness or numbness in your arms or legs. Summary  Oral contraceptive pills (OCPs) are medicines that you take to prevent pregnancy.  OCPs do not prevent sexually transmitted infections (STIs). Always use a condom to protect against STIs.  When you start an OCP, be aware that it can take 2-3 months for your body to adjust to changes in hormone levels.  Read all the information and directions that come with your OCP. This information is not intended to replace advice given to you by your health care provider. Make sure you discuss any questions you have with your health care provider. Document Revised: 03/17/2019 Document Reviewed: 01/04/2017 Elsevier Patient Education  2020 Elsevier Inc.  

## 2020-05-01 ENCOUNTER — Encounter: Payer: Self-pay | Admitting: Internal Medicine

## 2020-05-03 ENCOUNTER — Ambulatory Visit: Payer: Medicaid Other | Admitting: Podiatry

## 2020-05-07 ENCOUNTER — Telehealth (INDEPENDENT_AMBULATORY_CARE_PROVIDER_SITE_OTHER): Payer: Medicaid Other | Admitting: Internal Medicine

## 2020-05-07 DIAGNOSIS — F418 Other specified anxiety disorders: Secondary | ICD-10-CM | POA: Diagnosis not present

## 2020-05-07 DIAGNOSIS — K589 Irritable bowel syndrome without diarrhea: Secondary | ICD-10-CM | POA: Diagnosis not present

## 2020-05-07 DIAGNOSIS — Z7689 Persons encountering health services in other specified circumstances: Secondary | ICD-10-CM

## 2020-05-07 DIAGNOSIS — G4700 Insomnia, unspecified: Secondary | ICD-10-CM

## 2020-05-07 DIAGNOSIS — R7303 Prediabetes: Secondary | ICD-10-CM

## 2020-05-07 MED ORDER — SERTRALINE HCL 50 MG PO TABS: 150.0000 mg | ORAL_TABLET | Freq: Every day | ORAL | 1 refills | Status: DC

## 2020-05-07 MED ORDER — TRAZODONE HCL 50 MG PO TABS: 25.0000 mg | ORAL_TABLET | Freq: Every evening | ORAL | 3 refills | Status: DC | PRN

## 2020-05-07 MED ORDER — TRULANCE 3 MG PO TABS: 1.0000 | ORAL_TABLET | Freq: Every day | ORAL | 1 refills | Status: DC

## 2020-05-07 MED ORDER — METFORMIN HCL 850 MG PO TABS: 850.0000 mg | ORAL_TABLET | Freq: Two times a day (BID) | ORAL | 1 refills | Status: DC

## 2020-05-07 NOTE — Progress Notes (Signed)
Virtual Visit via Telephone Note  I connected with Sara Cervantes, on 05/07/2020 at 2:11 PM by telephone due to the COVID-19 pandemic and verified that I am speaking with the correct person using two identifiers.   Consent: I discussed the limitations, risks, security and privacy concerns of performing an evaluation and management service by telephone and the availability of in person appointments. I also discussed with the patient that there may be a patient responsible charge related to this service. The patient expressed understanding and agreed to proceed.   Location of Patient: Home   Location of Provider: Clinic    Persons participating in Telemedicine visit: Yvana Samonte [facility] Dr. Earlene Plater      History of Present Illness: Patient has a visit to establish care. PMH of depression and anxiety. Has a history of asthma as well. PMH of IBS on Trulance. She had a cholecystectomy in 2016. She previously had a Nexplanon removed and now is taking OCP.   Reports depression and anxiety have not been good for the past month and half. Lots of trouble with sleeping and nightmares. Having more frequent panic attacks. She has been struggling because she missed out on college, is pretty much homeless, and has found out a family member has not been doing well. Just had to bury a loved one around time her symptoms increased.   Depression screen [facility] 2/9 05/07/2020 04/26/2020  Decreased Interest 2 0  Down, Depressed, Hopeless 2 1  PHQ - 2 Score 4 1  Altered sleeping 2 1  Tired, decreased energy 2 2  Change in appetite 1 1  Feeling bad or failure about yourself  1 0  Trouble concentrating 1 0  Moving slowly or fidgety/restless 0 0  Suicidal thoughts 0 0  PHQ-9 Score 11 5   GAD 7 : Generalized Anxiety Score 05/07/2020 04/26/2020  Nervous, Anxious, on Edge 3 0  Control/stop worrying 3 0  Worry too much - different things 3 0  Trouble relaxing 3 1  Restless 0 0  Easily annoyed or  irritable 1 0  Afraid - awful might happen 2 0  Total GAD 7 Score 15 1  Anxiety Difficulty - Not difficult at all      Past Medical History:  Diagnosis Date  . Depression with anxiety   . Exercise-induced asthma   . Hearing deficit, bilateral   . IBS (irritable bowel syndrome)   . Migraines   . Morbid obesity ([facility])   . Prediabetes   . S/P laparoscopic cholecystectomy   . Seasonal allergies    Allergies  Allergen Reactions  . Penicillins Hives    Current Outpatient Medications on File Prior to Visit  Medication Sig Dispense Refill  . albuterol (VENTOLIN HFA) 108 (90 Base) MCG/ACT inhaler SMARTSIG:2 Puff(s) By Mouth Every 6 Hours PRN    . cetirizine (ZYRTEC ALLERGY) 10 MG tablet Take 1 tablet (10 mg total) by mouth daily. 15 tablet 0  . eletriptan (RELPAX) 20 MG tablet Take 20 mg by mouth 2 (two) times daily as needed.    . fluticasone (FLONASE) 50 MCG/ACT nasal spray Place 2 sprays into the nose daily.     . hydrOXYzine (ATARAX/VISTARIL) 10 MG tablet Take 10 mg by mouth at bedtime.     . metFORMIN (GLUCOPHAGE) 850 MG tablet Take 850 mg by mouth 2 (two) times daily with a meal.    . Norethindrone-Ethinyl Estradiol-Fe Biphas (LO LOESTRIN FE) 1 MG-10 MCG / 10 MCG tablet Take 1 tablet by mouth  daily. 1 Package 3  . sertraline (ZOLOFT) 100 MG tablet Take 100 mg by mouth daily.    . TRULANCE 3 MG TABS Take 1 tablet by mouth daily.    . [DISCONTINUED] famotidine (PEPCID) 20 MG tablet Take 20 mg by mouth 2 (two) times daily.     No current facility-administered medications on file prior to visit.    Observations/Objective: NAD. Speaking clearly.  Work of breathing normal.  Alert and oriented. Mood appropriate.   Assessment and Plan: 1. Encounter to establish care Reviewed patient's PMH, social history, surgical history, and medications.  Is overdue for annual exam, screening blood work, and health maintenance topics. Have asked patient to return for visit to address these  items.   2. Depression with anxiety PHQ-9 and GAD-7 increased. Denies SI. Likely worsened by recent life stressors and grief reaction. Insomnia could be worsening her symptoms as well. Will increase Zoloft to 150 mg daily from 100 mg. Visit with Christa See, LCSW was scehduled for 6/8.  - sertraline (ZOLOFT) 50 MG tablet; Take 3 tablets (150 mg total) by mouth daily.  Dispense: 90 tablet; Refill: 1  3. Prediabetes Will plan to repeat A1c at annual exam.  - metFORMIN (GLUCOPHAGE) 850 MG tablet; Take 1 tablet (850 mg total) by mouth 2 (two) times daily with a meal.  Dispense: 180 tablet; Refill: 1  4. Irritable bowel syndrome, unspecified type - TRULANCE 3 MG TABS; Take 1 tablet by mouth daily.  Dispense: 90 tablet; Refill: 1  5. Insomnia, unspecified type Will start Trazodone in attempt to improve sleep cycle and improve anxiety/depression. Counseled that while serotonin syndrome with combined use of SSRI and Trazodone is low, that it is emergency situation and instructed to seek care if she has any concerning symptoms.  - traZODone (DESYREL) 50 MG tablet; Take 0.5-1 tablets (25-50 mg total) by mouth at bedtime as needed for sleep.  Dispense: 30 tablet; Refill: 3   Follow Up Instructions: Annual exam; F/u anxiety/depression/insomnia    I discussed the assessment and treatment plan with the patient. The patient was provided an opportunity to ask questions and all were answered. The patient agreed with the plan and demonstrated an understanding of the instructions.   The patient was advised to call back or seek an in-person evaluation if the symptoms worsen or if the condition fails to improve as anticipated.     I provided 16 minutes total of non-face-to-face time during this encounter including median intraservice time, reviewing previous notes, investigations, ordering medications, medical decision making, coordinating care and patient verbalized understanding at the end of the  visit.    Phill Myron, D.O. Primary [facility] at [facility]  05/07/2020, 2:11 PM

## 2020-05-14 ENCOUNTER — Ambulatory Visit (INDEPENDENT_AMBULATORY_CARE_PROVIDER_SITE_OTHER): Payer: Medicaid Other | Admitting: Licensed Clinical Social Worker

## 2020-05-14 ENCOUNTER — Other Ambulatory Visit: Payer: Self-pay

## 2020-05-14 DIAGNOSIS — F411 Generalized anxiety disorder: Secondary | ICD-10-CM

## 2020-05-14 DIAGNOSIS — F331 Major depressive disorder, recurrent, moderate: Secondary | ICD-10-CM

## 2020-05-15 ENCOUNTER — Ambulatory Visit: Payer: Medicaid Other | Admitting: Podiatry

## 2020-05-17 NOTE — BH Specialist Note (Signed)
Integrated Behavioral Health Visit via Telemedicine (Telephone)  05/14/2020 Sara Cervantes [id number]   Session Start time: 2:35 PM  Session End time: 3:00 PM Total time: 25  Referring Provider: Dr. Earlene Plater Type of Visit: Telephonic Patient location: Home [facility] Provider location: Office All persons participating in visit: LCSW and Patient  Confirmed patient's address: Yes  Confirmed patient's phone number: Yes  Any changes to demographics: No   Confirmed patient's insurance: Yes  Any changes to patient's insurance: No   Discussed confidentiality: Yes    The following statements were read to the patient and/or legal guardian that are established with the [facility] Provider.  "The purpose of this phone visit is to provide behavioral health care while limiting exposure to the coronavirus (COVID19).  There is a possibility of technology failure and discussed alternative modes of communication if that failure occurs."  "By engaging in this telephone visit, you consent to the provision of healthcare.  Additionally, you authorize for your insurance to be billed for the services provided during this telephone visit."   Patient and/or legal guardian consented to telephone visit: Yes   PRESENTING CONCERNS: Patient and/or family reports the following symptoms/concerns: Patient reports difficulty managing depression and anxiety symptoms triggered by psychosocial stressors.  Symptoms include increase in itching, racing thoughts, difficulty sleeping, and fluctuation in appetite.  History of panic attacks Duration of problem: With anxiety 6 years ago and depression 2 years ago; Severity of problem: moderate  STRENGTHS (Protective Factors/Coping Skills): Patient has good insight Patient has support system  Patient is participating in medication management  GOALS ADDRESSED: Patient will: 1.  Reduce symptoms of: anxiety and depression  2.  Increase knowledge and/or ability of:  coping skills and healthy habits  3.  Demonstrate ability to: Increase healthy adjustment to current life circumstances and Increase adequate support systems for patient/family  INTERVENTIONS: Interventions utilized:  Solution-Focused Strategies, Supportive Counseling and Psychoeducation and/or Health Education Standardized Assessments completed: Not Needed  ASSESSMENT: Patient was experiencing increase in depression anxiety symptoms triggered by psychosocial stressors.  Per patient, symptoms have decreased since initiation of medication management through PCP.   Patient may benefit from continued medication management and therapy.  LCSW provided validation and support.  Therapeutic strategies discussed to assist in decrease and/or management of symptoms.  Patient is not interested in therapy at this time.  No resource needs identified.  PLAN: 1. Follow up with behavioral health clinician on : Pt was encouraged to contact LCSWA if symptoms worsen or fail to improve to schedule behavioral appointments at [facility] 2. Behavioral recommendations: Continue medication management through PCP 3. Referral(s): [facility] [employer] (In Clinic)  Bridgett Larsson, [place] 05/17/2020 9:31 AM

## 2020-05-23 ENCOUNTER — Telehealth: Payer: Self-pay

## 2020-05-23 NOTE — Patient Instructions (Signed)

## 2020-05-23 NOTE — Telephone Encounter (Signed)

## 2020-05-24 ENCOUNTER — Other Ambulatory Visit: Payer: Self-pay

## 2020-05-24 ENCOUNTER — Encounter: Payer: Self-pay | Admitting: Internal Medicine

## 2020-05-24 ENCOUNTER — Ambulatory Visit (INDEPENDENT_AMBULATORY_CARE_PROVIDER_SITE_OTHER): Payer: Medicaid Other | Admitting: Internal Medicine

## 2020-05-24 VITALS — BP 104/68 | HR 69 | Temp 98.2°F | Resp 17 | Ht 63.5 in | Wt 293.0 lb

## 2020-05-24 DIAGNOSIS — Z1159 Encounter for screening for other viral diseases: Secondary | ICD-10-CM

## 2020-05-24 DIAGNOSIS — F418 Other specified anxiety disorders: Secondary | ICD-10-CM

## 2020-05-24 DIAGNOSIS — Z114 Encounter for screening for human immunodeficiency virus [HIV]: Secondary | ICD-10-CM

## 2020-05-24 DIAGNOSIS — Z0000 Encounter for general adult medical examination without abnormal findings: Secondary | ICD-10-CM | POA: Diagnosis not present

## 2020-05-24 DIAGNOSIS — R7303 Prediabetes: Secondary | ICD-10-CM | POA: Diagnosis not present

## 2020-05-24 NOTE — Progress Notes (Signed)
Subjective:    Sara Cervantes - 19 y.o. female MRN [id number]  Date of birth: 09/19/2001  HPI  Sara Cervantes is here for annual exam.  She reports since increasing Zoloft to 150 mg from 100 mg that her mood seems much more stable. She has been taking Trazodone 25 mg for sleep; 50 mg made it difficult for her to wake up in the AM. She has had a couple nights waking up with anxiety/nightmares. Her dog has been able to do pressure points to relieve this and help her calm down to fall back asleep. Asks about letter to use dog for support during school year at [employer].   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 Plans to go to PT school after graduating undergrad.   Depression screen [facility] 2/9 05/24/2020 05/07/2020 04/26/2020  Decreased Interest 1 2 0  Down, Depressed, Hopeless 2 2 1   PHQ - 2 Score 3 4 1   Altered sleeping 3 2 1   Tired, decreased energy 2 2 2   Change in appetite 0 1 1  Feeling bad or failure about yourself  0 1 0  Trouble concentrating 0 1 0  Moving slowly or fidgety/restless 0 0 0  Suicidal thoughts 0 0 0  PHQ-9 Score 8 11 5    GAD 7 : Generalized Anxiety Score 05/24/2020 05/07/2020 04/26/2020  Nervous, Anxious, on Edge 1 3 0  Control/stop worrying 0 3 0  Worry too much - different things 0 3 0  Trouble relaxing 0 3 1  Restless 1 0 0  Easily annoyed or irritable 1 1 0  Afraid - awful might happen 0 2 0  Total GAD 7 Score 3 15 1   Anxiety Difficulty - - Not difficult at all      Health Maintenance:  Health Maintenance Due  Topic Date Due  . Hepatitis C Screening  Never done    -  reports that she has never smoked. She has never used smokeless tobacco. - Review of Systems: Per HPI. - Past Medical History: Patient Active Problem List   Diagnosis Date Noted  . Prediabetes 05/07/2020  . Bilateral hearing loss 02/06/2019  . Depression with anxiety 11/04/2018  . Environmental and seasonal allergies 11/04/2018  . Exercise-induced asthma 11/04/2018  .  Migraine without aura and without status migrainosus, not intractable 11/04/2018  . Obesity, Class III, BMI 40-49.9 (morbid obesity) (HCC) 11/04/2018  . Irritable bowel syndrome 03/03/2018  . S/P laparoscopic cholecystectomy 02/06/2017   - Medications: reviewed and updated   Objective:   Physical Exam BP 104/68   Pulse 69   Temp 98.2 F (36.8 C) (Temporal)   Resp 17   Ht 5' 3.5" (1.613 m)   Wt 293 lb (132.9 kg)   SpO2 97%   BMI 51.09 kg/m  Physical Exam Constitutional:      Appearance: She is not diaphoretic.  HENT:     Head: Normocephalic and atraumatic.  Eyes:     Conjunctiva/sclera: Conjunctivae normal.     Pupils: Pupils are equal, round, and reactive to light.  Neck:     Thyroid: No thyromegaly.  Cardiovascular:     Rate and Rhythm: Normal rate and regular rhythm.     Heart sounds: Normal heart sounds. No murmur heard.   Pulmonary:     Effort: Pulmonary effort is normal. No respiratory distress.     Breath sounds: Normal breath sounds. No wheezing.  Abdominal:     General: Bowel sounds are normal. There is no distension.  Palpations: Abdomen is soft.     Tenderness: There is no abdominal tenderness. There is no guarding or rebound.  Musculoskeletal:        General: No deformity. Normal range of motion.     Cervical back: Normal range of motion and neck supple.  Lymphadenopathy:     Cervical: No cervical adenopathy.  Skin:    General: Skin is warm and dry.     Findings: No rash.  Neurological:     Mental Status: She is alert and oriented to person, place, and time.     Gait: Gait is intact.  Psychiatric:        Mood and Affect: Mood and affect normal.        Judgment: Judgment normal.            Assessment & Plan:   1. Annual physical exam Counseled on 150 minutes of exercise per week, healthy eating (including decreased daily intake of saturated fats, cholesterol, added sugars, sodium), STI prevention, routine healthcare maintenance. Declined  STD screening.  - CBC with Differential - Comprehensive metabolic panel - Lipid Panel  2. Need for hepatitis C screening test - Hepatitis C Antibody  3. Prediabetes Unable to view previous A1c result. Will repeat. She is on metformin 850 mg BID.  - Hemoglobin A1c  4. Depression with anxiety PHQ-9 and GAD-7 have improved with increase in Zoloft. Notably anxiety symptoms much more controlled now. Patient saw Christa See, LCSW and declined establishing with therapist. Will continue to monitor. Trazodone prn for sleep. Wrote letter for support dog at school. Return in 6 months or sooner if feels symptoms have worsened or wants to change medication therapy/trial discontinuation.   5. Screening for HIV (human immunodeficiency virus) - HIV Antibody (routine testing w rflx)     Phill Myron, D.O. 05/24/2020, 11:04 AM Primary Care at [facility]

## 2020-05-25 LAB — COMPREHENSIVE METABOLIC PANEL
ALT: 12 IU/L (ref 0–32)
AST: 12 IU/L (ref 0–40)
Albumin/Globulin Ratio: 1.6 (ref 1.2–2.2)
Albumin: 4.4 g/dL (ref 3.9–5.0)
Alkaline Phosphatase: 109 IU/L — ABNORMAL HIGH (ref 45–106)
BUN/Creatinine Ratio: 13 (ref 9–23)
BUN: 10 mg/dL (ref 6–20)
Bilirubin Total: 0.2 mg/dL (ref 0.0–1.2)
CO2: 24 mmol/L (ref 20–29)
Calcium: 9.4 mg/dL (ref 8.7–10.2)
Chloride: 98 mmol/L (ref 96–106)
Creatinine, Ser: 0.76 mg/dL (ref 0.57–1.00)
GFR calc Af Amer: 131 mL/min/{1.73_m2} (ref >59)
GFR calc non Af Amer: 113 mL/min/{1.73_m2} (ref >59)
Globulin, Total: 2.8 g/dL (ref 1.5–4.5)
Glucose: 83 mg/dL (ref 65–99)
Potassium: 4.3 mmol/L (ref 3.5–5.2)
Sodium: 140 mmol/L (ref 134–144)
Total Protein: 7.2 g/dL (ref 6.0–8.5)

## 2020-05-25 LAB — CBC WITH DIFFERENTIAL/PLATELET
Basophils Absolute: 0 10*3/uL (ref 0.0–0.2)
Basos: 0 %
EOS (ABSOLUTE): 0.1 10*3/uL (ref 0.0–0.4)
Eos: 1 %
Hematocrit: 36.6 % (ref 34.0–46.6)
Hemoglobin: 11.8 g/dL (ref 11.1–15.9)
Immature Grans (Abs): 0 10*3/uL (ref 0.0–0.1)
Immature Granulocytes: 0 %
Lymphocytes Absolute: 2.4 10*3/uL (ref 0.7–3.1)
Lymphs: 27 %
MCH: 24.9 pg — ABNORMAL LOW (ref 26.6–33.0)
MCHC: 32.2 g/dL (ref 31.5–35.7)
MCV: 77 fL — ABNORMAL LOW (ref 79–97)
Monocytes Absolute: 0.5 10*3/uL (ref 0.1–0.9)
Monocytes: 6 %
Neutrophils Absolute: 5.9 10*3/uL (ref 1.4–7.0)
Neutrophils: 66 %
Platelets: 359 10*3/uL (ref 150–450)
RBC: 4.74 x10E6/uL (ref 3.77–5.28)
RDW: 13.6 % (ref 11.7–15.4)
WBC: 8.9 10*3/uL (ref 3.4–10.8)

## 2020-05-25 LAB — HEMOGLOBIN A1C
Est. average glucose Bld gHb Est-mCnc: 123 mg/dL
Hgb A1c MFr Bld: 5.9 % — ABNORMAL HIGH (ref 4.8–5.6)

## 2020-05-25 LAB — HIV ANTIBODY (ROUTINE TESTING W REFLEX): HIV Screen 4th Generation wRfx: NONREACTIVE

## 2020-05-25 LAB — LIPID PANEL
Chol/HDL Ratio: 3.1 ratio (ref 0.0–4.4)
Cholesterol, Total: 164 mg/dL (ref 100–199)
HDL: 53 mg/dL (ref >39)
LDL Chol Calc (NIH): 90 mg/dL (ref 0–99)
Triglycerides: 119 mg/dL (ref 0–149)
VLDL Cholesterol Cal: 21 mg/dL (ref 5–40)

## 2020-05-25 LAB — HEPATITIS C ANTIBODY: Hep C Virus Ab: <0.1 s/co ratio (ref 0.0–0.9)

## 2020-05-29 NOTE — Progress Notes (Signed)
Patient notified of results & recommendations via MyChart per request.

## 2020-06-12 ENCOUNTER — Telehealth: Payer: Self-pay

## 2020-06-12 DIAGNOSIS — K589 Irritable bowel syndrome without diarrhea: Secondary | ICD-10-CM

## 2020-06-12 NOTE — Telephone Encounter (Signed)
Patient sent in MyChart message requesting a referral to GI for her IBS.  Please advise. Referral is pending.

## 2020-06-12 NOTE — Telephone Encounter (Signed)
I signed the referral. Thanks!   Marcy Siren, D.O. Primary Care at [facility]  06/12/2020, 1:43 PM

## 2020-06-13 ENCOUNTER — Telehealth: Payer: Medicaid Other | Admitting: Internal Medicine

## 2020-06-13 ENCOUNTER — Encounter: Payer: Self-pay | Admitting: Physician Assistant

## 2020-07-09 ENCOUNTER — Encounter: Payer: Self-pay | Admitting: Internal Medicine

## 2020-07-09 ENCOUNTER — Telehealth (INDEPENDENT_AMBULATORY_CARE_PROVIDER_SITE_OTHER): Payer: Medicaid Other | Admitting: Internal Medicine

## 2020-07-09 DIAGNOSIS — M545 Low back pain, unspecified: Secondary | ICD-10-CM

## 2020-07-09 DIAGNOSIS — G4700 Insomnia, unspecified: Secondary | ICD-10-CM | POA: Diagnosis not present

## 2020-07-09 NOTE — Progress Notes (Signed)
Virtual Visit via Telephone Note  I connected with Sara Cervantes, on 07/09/2020 at 3:49 PM by telephone due to the COVID-19 pandemic and verified that I am speaking with the correct person using two identifiers.   Consent: I discussed the limitations, risks, security and privacy concerns of performing an evaluation and management service by telephone and the availability of in person appointments. I also discussed with the patient that there may be a patient responsible charge related to this service. The patient expressed understanding and agreed to proceed.   Location of Patient: Home   Location of Provider: Clinic    Persons participating in Telemedicine visit: Eniya Cannady [facility] Dr. Earlene Plater   History of Present Illness: Patient wants to see if she could get a referral to chiropractor in [place]. She used to see this provider frequently. Has had chronic neck and low back pain. Seems to have flared up again. No recent injury. No fevers. No changes in bowel or bladder function. No saddle anesthesia.    Past Medical History:  Diagnosis Date  . Allergy    Phreesia 07/07/2020  . Anxiety    Phreesia 07/07/2020  . Asthma    Phreesia 07/07/2020  . Depression with anxiety   . Diabetes mellitus without complication (HCC)    Phreesia 07/07/2020  . Exercise-induced asthma   . Hearing deficit, bilateral   . IBS (irritable bowel syndrome)   . Migraines   . Morbid obesity (HCC)   . Prediabetes   . S/P laparoscopic cholecystectomy   . Seasonal allergies    Allergies  Allergen Reactions  . Penicillins Hives    Current Outpatient Medications on File Prior to Visit  Medication Sig Dispense Refill  . albuterol (VENTOLIN HFA) 108 (90 Base) MCG/ACT inhaler SMARTSIG:2 Puff(s) By Mouth Every 6 Hours PRN    . eletriptan (RELPAX) 20 MG tablet Take 20 mg by mouth 2 (two) times daily as needed.    . fluticasone (FLONASE) 50 MCG/ACT nasal spray Place 2 sprays into the  nose daily.     . hydrOXYzine (ATARAX/VISTARIL) 10 MG tablet Take 10 mg by mouth at bedtime.     . metFORMIN (GLUCOPHAGE) 850 MG tablet Take 1 tablet (850 mg total) by mouth 2 (two) times daily with a meal. 180 tablet 1  . Norethindrone-Ethinyl Estradiol-Fe Biphas (LO LOESTRIN FE) 1 MG-10 MCG / 10 MCG tablet Take 1 tablet by mouth daily. 1 Package 3  . prazosin (MINIPRESS) 1 MG capsule Take 1 mg by mouth at bedtime.    . sertraline (ZOLOFT) 50 MG tablet Take 3 tablets (150 mg total) by mouth daily. 90 tablet 1  . traZODone (DESYREL) 50 MG tablet Take 0.5-1 tablets (25-50 mg total) by mouth at bedtime as needed for sleep. 30 tablet 3  . TRULANCE 3 MG TABS Take 1 tablet by mouth daily. 90 tablet 1  . [DISCONTINUED] famotidine (PEPCID) 20 MG tablet Take 20 mg by mouth 2 (two) times daily.     No current facility-administered medications on file prior to visit.    Observations/Objective: NAD. Speaking clearly.  Work of breathing normal.  Alert and oriented. Mood appropriate.   Assessment and Plan: 1. Bilateral low back pain without sciatica, unspecified chronicity No red flag symptoms or injury. Will refer to chiropractor per patient's request. Wants to trial this and declines NSAIDs/muscle relaxers.  - Ambulatory referral to Chiropractic  2. Insomnia, unspecified type Has improved significantly with Trazodone. Continue prn.     Follow Up Instructions: PRN  and for routine medical care    I discussed the assessment and treatment plan with the patient. The patient was provided an opportunity to ask questions and all were answered. The patient agreed with the plan and demonstrated an understanding of the instructions.   The patient was advised to call back or seek an in-person evaluation if the symptoms worsen or if the condition fails to improve as anticipated.     I provided 10 minutes total of non-face-to-face time during this encounter including median intraservice time, reviewing  previous notes, investigations, ordering medications, medical decision making, coordinating care and patient verbalized understanding at the end of the visit.    Marcy Siren, D.O. Primary Care at [facility]  07/09/2020, 3:49 PM

## 2020-07-15 ENCOUNTER — Ambulatory Visit (INDEPENDENT_AMBULATORY_CARE_PROVIDER_SITE_OTHER): Payer: Medicaid Other | Admitting: Physician Assistant

## 2020-07-15 ENCOUNTER — Other Ambulatory Visit (INDEPENDENT_AMBULATORY_CARE_PROVIDER_SITE_OTHER): Payer: Medicaid Other

## 2020-07-15 ENCOUNTER — Encounter: Payer: Self-pay | Admitting: Physician Assistant

## 2020-07-15 VITALS — BP 100/74 | HR 76 | Ht 65.0 in | Wt 294.0 lb

## 2020-07-15 DIAGNOSIS — R140 Abdominal distension (gaseous): Secondary | ICD-10-CM

## 2020-07-15 DIAGNOSIS — R109 Unspecified abdominal pain: Secondary | ICD-10-CM | POA: Diagnosis not present

## 2020-07-15 DIAGNOSIS — R195 Other fecal abnormalities: Secondary | ICD-10-CM

## 2020-07-15 DIAGNOSIS — R1084 Generalized abdominal pain: Secondary | ICD-10-CM

## 2020-07-15 LAB — CBC WITH DIFFERENTIAL/PLATELET
Basophils Absolute: 0 10*3/uL (ref 0.0–0.1)
Basophils Relative: 0.4 % (ref 0.0–3.0)
Eosinophils Absolute: 0.1 10*3/uL (ref 0.0–0.7)
Eosinophils Relative: 1.4 % (ref 0.0–5.0)
HCT: 36.5 % (ref 36.0–46.0)
Hemoglobin: 11.8 g/dL — ABNORMAL LOW (ref 12.0–15.0)
Lymphocytes Relative: 29.8 % (ref 12.0–46.0)
Lymphs Abs: 2.5 10*3/uL (ref 0.7–4.0)
MCHC: 32.3 g/dL (ref 30.0–36.0)
MCV: 77.6 fl — ABNORMAL LOW (ref 78.0–100.0)
Monocytes Absolute: 0.6 10*3/uL (ref 0.1–1.0)
Monocytes Relative: 7.1 % (ref 3.0–12.0)
Neutro Abs: 5.3 10*3/uL (ref 1.4–7.7)
Neutrophils Relative %: 61.3 % (ref 43.0–77.0)
Platelets: 334 10*3/uL (ref 150.0–400.0)
RBC: 4.7 Mil/uL (ref 3.87–5.11)
RDW: 14.1 % (ref 11.5–14.6)
WBC: 8.6 10*3/uL (ref 4.5–10.5)

## 2020-07-15 LAB — FERRITIN: Ferritin: 26 ng/mL (ref 10.0–291.0)

## 2020-07-15 LAB — SEDIMENTATION RATE: Sed Rate: 33 mm/hr — ABNORMAL HIGH (ref 0–20)

## 2020-07-15 LAB — IBC PANEL
Iron: 33 ug/dL — ABNORMAL LOW (ref 42–145)
Saturation Ratios: 5.5 % — ABNORMAL LOW (ref 20.0–50.0)
Transferrin: 425 mg/dL — ABNORMAL HIGH (ref 212.0–360.0)

## 2020-07-15 LAB — C-REACTIVE PROTEIN: CRP: 1.9 mg/dL (ref 0.5–20.0)

## 2020-07-15 MED ORDER — GLYCOPYRROLATE 2 MG PO TABS
2.0000 mg | ORAL_TABLET | Freq: Two times a day (BID) | ORAL | 3 refills | Status: DC | PRN
Start: 2020-07-15 — End: 2020-09-05

## 2020-07-15 NOTE — Patient Instructions (Addendum)
If you are age 20 or older, your body mass index should be between 23-30. Your Body mass index is 48.92 kg/m. If this is out of the aforementioned range listed, please consider follow up with your Primary Care Provider.  If you are age 83 or younger, your body mass index should be between 19-25. Your Body mass index is 48.92 kg/m. If this is out of the aformentioned range listed, please consider follow up with your Primary Care Provider.   Your provider has requested that you go to the basement level for lab work before leaving today. Press "B" on the elevator. The lab is located at the first door on the left as you exit the elevator.  You have been scheduled for a CT scan of the abdomen and pelvis at [facility], 1st floor Radiology. You are scheduled on 07/29/20  at 7:30 am. You should arrive 15 minutes prior to your appointment time for registration. Please follow the written instructions below on the day of your exam:    1) Do not eat anything after 3:30 am (4 hours prior to your test)  2) You need to pick up (At least 3 days prior to your procedure you will need to pick up) 2 bottles of oral contrast to drink.  The solution may taste better if refrigerated, but do NOT add ice or any other liquid to this solution. Shake well before drinking.   Drink 1 bottle of contrast @ 5:30 am (2 hours prior to your exam)  Drink 1 bottle of contrast @ 6:30 am (1 hour prior to your exam)   You may take any medications as prescribed with a small amount of water, if necessary. If you take any of the following medications: METFORMIN, GLUCOPHAGE, GLUCOVANCE, AVANDAMET, RIOMET, FORTAMET, Cable MET, JANUMET, GLUMETZA or METAGLIP, you MAY be asked to HOLD this medication 48 hours AFTER the exam.   The purpose of you drinking the oral contrast is to aid in the visualization of your intestinal tract. The contrast solution may cause some diarrhea. Depending on your individual set of symptoms, you may also receive  an intravenous injection of x-ray contrast/dye. Plan on being at [facility] for 45 minutes or longer, depending on the type of exam you are having performed.   If you have any questions regarding your exam or if you need to reschedule, you may call [facility] at [phone] between the hours of 8:00 am and 5:00 pm, Monday-Friday.   Due to recent changes in healthcare laws, you may see the results of your imaging and laboratory studies on MyChart before your provider has had a chance to review them.  We understand that in some cases there may be results that are confusing or concerning to you. Not all laboratory results come back in the same time frame and the provider may be waiting for multiple results in order to interpret others.  Please give [place] 48 hours in order for your provider to thoroughly review all the results before contacting the office for clarification of your results.   STOP using Trulance  START Glycopyrrolate 2 mg 1 tablet twice a day as needed for cramping or bloating  Follow up pending your labs and CT.

## 2020-07-15 NOTE — Progress Notes (Signed)
Subjective:    Patient ID: Sara Cervantes, female    DOB: [birth year], 19 y.o.   MRN: [id number]  HPI Kyeshia is a pleasant 19 year old white female, new to GI today and self-referred.  She was diagnosed with IBS previously and had been seen by a gastroenterologist in [facility] a couple of years ago.  She does not think that she had undergone colonoscopy,, and does not remember exactly what testing she did have.  She says she has been on Trulance since she was about 14 for chronic constipation and IBS.  She said previous this had worked fairly well but does not seem to be working this past year.  She is now having several bowel movements per day, anywhere from 4-8 bowel movements and generally loose.  She says she is having a lot of cramping on a regular basis, and bloating.  She also feels that she has been retaining water weight in her abdomen, thighs and neck area.  She has been trying to eat gluten-free but has not totally been gluten-free.  No melena or hematochezia. She was started on Metformin earlier this year for prediabetes, no other new medications. CBC June 2021 WBC 8.9, hemoglobin 11.8/hematocrit 36.6 and MCV of 77 c-Met unremarkable/alk phos 109 Family history is negative for GI disease as far she is aware. Review of Systems Pertinent positive and negative review of systems were noted in the above HPI section.  All other review of systems was otherwise negative.  Outpatient Encounter Medications as of 20/08/2020  Medication Sig  . albuterol (VENTOLIN HFA) 108 (90 Base) MCG/ACT inhaler SMARTSIG:2 Puff(s) By Mouth Every 6 Hours PRN  . eletriptan (RELPAX) 20 MG tablet Take 20 mg by mouth 2 (two) times daily as needed.  . famotidine (PEPCID) 20 MG tablet Take 20 mg by mouth daily.  . fluticasone (FLONASE) 50 MCG/ACT nasal spray Place 2 sprays into the nose daily.   . hydrOXYzine (ATARAX/VISTARIL) 10 MG tablet Take 10 mg by mouth at bedtime.   . metFORMIN (GLUCOPHAGE) 850 MG  tablet Take 1 tablet (850 mg total) by mouth 2 (two) times daily with a meal.  . Norethindrone-Ethinyl Estradiol-Fe Biphas (LO LOESTRIN FE) 1 MG-10 MCG / 10 MCG tablet Take 1 tablet by mouth daily.  . prazosin (MINIPRESS) 1 MG capsule Take 1 mg by mouth at bedtime.  . sertraline (ZOLOFT) 50 MG tablet Take 3 tablets (150 mg total) by mouth daily.  . traZODone (DESYREL) 50 MG tablet Take 0.5-1 tablets (25-50 mg total) by mouth at bedtime as needed for sleep.  . TRULANCE 3 MG TABS Take 1 tablet by mouth daily.  Marland Kitchen glycopyrrolate (ROBINUL) 2 MG tablet Take 1 tablet (2 mg total) by mouth 2 (two) times daily as needed (cramping/bloat).  . [DISCONTINUED] famotidine (PEPCID) 20 MG tablet Take 20 mg by mouth 2 (two) times daily.   No facility-administered encounter medications on file as of 20/08/2020.   Allergies  Allergen Reactions  . Penicillins Anaphylaxis and Hives   Patient Active Problem List   Diagnosis Date Noted  . Prediabetes 05/07/2020  . Bilateral hearing loss 02/06/2019  . Depression with anxiety 11/04/2018  . Environmental and seasonal allergies 11/04/2018  . Exercise-induced asthma 11/04/2018  . Migraine without aura and without status migrainosus, not intractable 11/04/2018  . Obesity, Class III, BMI 40-49.9 (morbid obesity) ([place]) 11/04/2018  . Irritable bowel syndrome 03/03/2018  . S/P laparoscopic cholecystectomy 02/06/2017   Social History   Socioeconomic History  . Marital status:  Single    Spouse name: Not on file  . Number of children: 0  . Years of education: Not on file  . Highest education level: Not on file  Occupational History  . Occupation: Ship broker  Tobacco Use  . Smoking status: Never Smoker  . Smokeless tobacco: Never Used  Vaping Use  . Vaping Use: Never used  Substance and Sexual Activity  . Alcohol use: Never  . Drug use: Never  . Sexual activity: Yes    Birth control/protection: Implant  Other Topics Concern  . Not on file  Social History  Narrative  . Not on file   Social Determinants of Health   Financial Resource Strain:   . Difficulty of Paying Living Expenses:   Food Insecurity:   . Worried About Charity fundraiser in the Last Year:   . Arboriculturist in the Last Year:   Transportation Needs:   . Film/video editor (Medical):   Marland Kitchen Lack of Transportation (Non-Medical):   Physical Activity:   . Days of Exercise per Week:   . Minutes of Exercise per Session:   Stress:   . Feeling of Stress :   Social Connections:   . Frequency of Communication with Friends and Family:   . Frequency of Social Gatherings with Friends and Family:   . Attends Religious Services:   . Active Member of Clubs or Organizations:   . Attends Archivist Meetings:   Marland Kitchen Marital Status:   Intimate Partner Violence:   . Fear of Current or Ex-Partner:   . Emotionally Abused:   Marland Kitchen Physically Abused:   . Sexually Abused:     Ms. Uttech's family history includes Breast cancer in her paternal grandmother; COPD in her maternal grandmother; Diabetes in her father; Hypertension in her father and mother; Kidney disease in her father and maternal grandfather; Lung cancer in her maternal grandfather; Other in her brother; Seizures in her brother and maternal grandmother.      Objective:    Vitals:   07/15/20 0819  BP: 100/74  Pulse: 76    Physical Exam Well-developed well-nourished young white female in no acute distress.  Height, Weight, 294 BMI 48.9  HEENT; nontraumatic normocephalic, EOMI, PER R LA, sclera anicteric. Oropharynx; not done today Neck; supple, no JVD Cardiovascular; regular rate and rhythm with S1-S2, no murmur rub or gallop Pulmonary; Clear bilaterally Abdomen; soft obese, there is some mild generalized tenderness but she is more tender in the left lower quadrant,, nondistended, no palpable mass or hepatosplenomegaly, bowel sounds are active Rectal; not done today Skin; benign exam, no jaundice rash or  appreciable lesions Extremities; no clubbing cyanosis or edema skin warm and dry Neuro/Psych; alert and oriented x4, grossly nonfocal mood and affect appropriate       Assessment & Plan:   #60 19 year old white female with previous diagnosis of IBS/constipation predominant and on Trulance daily over the last 5 years who comes in now with 6 to 58-monthhistory of frequent bowel movements with loose stools, complaints of abdominal cramping, and bloating.  She is having anywhere from 4-8 bowel movements per day. She has continued the Trulance daily as  was afraid to stop it.  Etiology of current symptoms is not entirely clear.  She is status post cholecystectomy in 2016.  She was also started on Metformin earlier this year and suspected that loose stools may be in part medication induced secondary to Metformin  Rule out IBD, rule out celiac disease  rule out IBS / alternating/mixed  #2 morbid obesity #3.  Status post cholecystectomy 2016 #4.  Depression #5.  History of migraines  Plan; CBC with differential, iron studies, sed rate, CRP TTG and IgA Schedule for CT of the abdomen and pelvis with contrast. Stop Trulance Start trial of glycopyrrolate 2 mg p.o. every morning and up to twice daily  Further recommendations pending results of above, will schedule follow-up based on above work-up. Patient will be established with Dr. Silverio Decamp.     Indie Nickerson Genia Harold PA-C 07/15/2020   Cc: Caryl Never*

## 2020-07-16 ENCOUNTER — Other Ambulatory Visit: Payer: Self-pay

## 2020-07-16 DIAGNOSIS — D509 Iron deficiency anemia, unspecified: Secondary | ICD-10-CM

## 2020-07-17 ENCOUNTER — Telehealth: Payer: Self-pay | Admitting: Physician Assistant

## 2020-07-17 LAB — GLIADIN ANTIBODIES, SERUM
Gliadin IgA: 4 Units
Gliadin IgG: <1 Units

## 2020-07-17 LAB — TISSUE TRANSGLUTAMINASE, IGA: (tTG) Ab, IgA: 1 U/mL

## 2020-07-17 LAB — RETICULIN ANTIBODIES, IGA W TITER: Reticulin IGA Screen: NEGATIVE

## 2020-07-17 NOTE — Telephone Encounter (Signed)
Spoke with this very nice patient. She wanted to know how bad the labs were. Discussed the low normal hgb and her actual hgb value. Discussed OTC iron supplements, best way to take them and the possible side effects of GI upset, constipation or black stool. All questions were answered to her satisfaction. She is advised to call us back if she has other concerns.

## 2020-07-18 ENCOUNTER — Telehealth: Payer: Self-pay | Admitting: Physician Assistant

## 2020-07-18 NOTE — Telephone Encounter (Signed)
Patient called states she was given Robinul and started about two days ago and now she is having bloody stool and is concerned please advise.

## 2020-07-18 NOTE — Telephone Encounter (Signed)
Patient reports she is seeing blood when cleaning after her bowel movement this morning. Stool was soft formed. She does not feel she is constipated. She has some rectal discomfort she describes as an itchy pain. She has spots of blood in her panties. She is taking Robinul. She does report a low abdominal tenderness. Denies any new symptoms.

## 2020-07-18 NOTE — Telephone Encounter (Signed)
I doubt any bleeding is related to Robinul- suspect secondary  hemorrhoids -would have her get Recticare complete and apply to anus 3-4 times daily until itching sensation subside- she is scheduled for a Ct

## 2020-07-18 NOTE — Telephone Encounter (Signed)
Patient advised. Samples at the front desk for her to pick up.

## 2020-07-22 ENCOUNTER — Ambulatory Visit: Payer: Medicaid Other

## 2020-07-22 NOTE — Progress Notes (Signed)
Reviewed and agree with documentation and assessment and plan. K. Veena Doyne Ellinger , MD   

## 2020-07-23 ENCOUNTER — Telehealth: Payer: Self-pay | Admitting: Physician Assistant

## 2020-07-23 ENCOUNTER — Other Ambulatory Visit: Payer: Self-pay

## 2020-07-23 DIAGNOSIS — R7303 Prediabetes: Secondary | ICD-10-CM

## 2020-07-23 NOTE — Telephone Encounter (Signed)
Spoke with the patient. She confirms she is taking Metformin. She will come for labs this week before her imaging on 07/29/20.

## 2020-07-24 ENCOUNTER — Other Ambulatory Visit (INDEPENDENT_AMBULATORY_CARE_PROVIDER_SITE_OTHER): Payer: Medicaid Other

## 2020-07-24 DIAGNOSIS — D509 Iron deficiency anemia, unspecified: Secondary | ICD-10-CM | POA: Diagnosis not present

## 2020-07-24 DIAGNOSIS — R7303 Prediabetes: Secondary | ICD-10-CM | POA: Diagnosis not present

## 2020-07-24 LAB — CBC WITH DIFFERENTIAL/PLATELET
Basophils Absolute: 0 10*3/uL (ref 0.0–0.1)
Basophils Relative: 0.3 % (ref 0.0–3.0)
Eosinophils Absolute: 0.1 10*3/uL (ref 0.0–0.7)
Eosinophils Relative: 0.7 % (ref 0.0–5.0)
HCT: 36.8 % (ref 36.0–46.0)
Hemoglobin: 11.9 g/dL — ABNORMAL LOW (ref 12.0–15.0)
Lymphocytes Relative: 26.9 % (ref 12.0–46.0)
Lymphs Abs: 2.6 10*3/uL (ref 0.7–4.0)
MCHC: 32.3 g/dL (ref 30.0–36.0)
MCV: 77.3 fl — ABNORMAL LOW (ref 78.0–100.0)
Monocytes Absolute: 0.5 10*3/uL (ref 0.1–1.0)
Monocytes Relative: 5.1 % (ref 3.0–12.0)
Neutro Abs: 6.6 10*3/uL (ref 1.4–7.7)
Neutrophils Relative %: 67 % (ref 43.0–77.0)
Platelets: 370 10*3/uL (ref 150.0–400.0)
RBC: 4.77 Mil/uL (ref 3.87–5.11)
RDW: 13.8 % (ref 11.5–14.6)
WBC: 9.8 10*3/uL (ref 4.5–10.5)

## 2020-07-24 LAB — CREATININE, SERUM: Creatinine, Ser: 0.91 mg/dL (ref 0.40–1.20)

## 2020-07-24 LAB — BUN: BUN: 11 mg/dL (ref 6–23)

## 2020-07-25 LAB — IRON,TIBC AND FERRITIN PANEL
%SAT: 12 % (calc) — ABNORMAL LOW (ref 16–45)
Ferritin: 31 ng/mL (ref 16–154)
Iron: 56 ug/dL (ref 40–190)
TIBC: 481 mcg/dL (calc) — ABNORMAL HIGH (ref 250–450)

## 2020-07-29 ENCOUNTER — Other Ambulatory Visit: Payer: Self-pay

## 2020-07-29 ENCOUNTER — Ambulatory Visit (HOSPITAL_COMMUNITY)
Admission: RE | Admit: 2020-07-29 | Discharge: 2020-07-29 | Disposition: A | Discharge status: Home / Self Care | Payer: Medicaid Other | Source: Ambulatory Visit | Attending: Physician Assistant | Admitting: Physician Assistant

## 2020-07-29 ENCOUNTER — Encounter (HOSPITAL_COMMUNITY): Payer: Self-pay

## 2020-07-29 DIAGNOSIS — R140 Abdominal distension (gaseous): Secondary | ICD-10-CM | POA: Diagnosis present

## 2020-07-29 DIAGNOSIS — R1084 Generalized abdominal pain: Secondary | ICD-10-CM | POA: Diagnosis not present

## 2020-07-29 DIAGNOSIS — R109 Unspecified abdominal pain: Secondary | ICD-10-CM | POA: Diagnosis present

## 2020-07-29 DIAGNOSIS — R195 Other fecal abnormalities: Secondary | ICD-10-CM | POA: Diagnosis present

## 2020-07-29 MED ORDER — SODIUM CHLORIDE (PF) 0.9 % IJ SOLN
INTRAMUSCULAR | Status: AC
  Filled 2020-07-29: qty 50

## 2020-07-29 MED ORDER — IOHEXOL 300 MG/ML  SOLN
100.0000 mL | Freq: Once | INTRAMUSCULAR | Status: AC | PRN
  Administered 2020-07-29: 100 mL via INTRAVENOUS

## 2020-08-01 ENCOUNTER — Telehealth (INDEPENDENT_AMBULATORY_CARE_PROVIDER_SITE_OTHER): Payer: Medicaid Other | Admitting: Internal Medicine

## 2020-08-01 ENCOUNTER — Other Ambulatory Visit: Payer: Self-pay

## 2020-08-01 ENCOUNTER — Encounter: Payer: Self-pay | Admitting: Internal Medicine

## 2020-08-01 DIAGNOSIS — Z974 Presence of external hearing-aid: Secondary | ICD-10-CM | POA: Diagnosis not present

## 2020-08-01 DIAGNOSIS — F418 Other specified anxiety disorders: Secondary | ICD-10-CM

## 2020-08-01 DIAGNOSIS — Z0289 Encounter for other administrative examinations: Secondary | ICD-10-CM | POA: Diagnosis not present

## 2020-08-01 NOTE — Progress Notes (Signed)
Virtual Visit via Telephone Note  I connected with Sara Cervantes, on 08/01/2020 at 2:03 PM by telephone due to the COVID-19 pandemic and verified that I am speaking with the correct person using two identifiers.   Consent: I discussed the limitations, risks, security and privacy concerns of performing an evaluation and management service by telephone and the availability of in person appointments. I also discussed with the patient that there may be a patient responsible charge related to this service. The patient expressed understanding and agreed to proceed.   Location of Patient: Home   Location of Provider: Clinic    Persons participating in Telemedicine visit: Khaniya Tenaglia [facility] Dr. Earlene Plater      History of Present Illness: Patient has a visit because she desires handicap placard. Called the [facility] and was told she qualified because her service dog is needed for mobility. She reports she is hard of hearing and her dog alerts her if there are cars nearby.    Past Medical History:  Diagnosis Date  . Allergy    Phreesia 07/07/2020  . Anxiety    Phreesia 07/07/2020  . Asthma    Phreesia 07/07/2020  . Cardiac arrhythmia   . Depression with anxiety   . Diabetes mellitus without complication (HCC)    Phreesia 07/07/2020  . Exercise-induced asthma   . Hearing deficit, bilateral   . IBS (irritable bowel syndrome)   . Migraines   . Morbid obesity (HCC)   . Prediabetes   . S/P laparoscopic cholecystectomy   . Seasonal allergies    Allergies  Allergen Reactions  . Penicillins Anaphylaxis and Hives    Current Outpatient Medications on File Prior to Visit  Medication Sig Dispense Refill  . albuterol (VENTOLIN HFA) 108 (90 Base) MCG/ACT inhaler SMARTSIG:2 Puff(s) By Mouth Every 6 Hours PRN    . eletriptan (RELPAX) 20 MG tablet Take 20 mg by mouth 2 (two) times daily as needed.    . famotidine (PEPCID) 20 MG tablet Take 20 mg by mouth daily.    . fluticasone  (FLONASE) 50 MCG/ACT nasal spray Place 2 sprays into the nose daily.     Marland Kitchen glycopyrrolate (ROBINUL) 2 MG tablet Take 1 tablet (2 mg total) by mouth 2 (two) times daily as needed (cramping/bloat). 60 tablet 3  . hydrOXYzine (ATARAX/VISTARIL) 10 MG tablet Take 10 mg by mouth at bedtime.     . metFORMIN (GLUCOPHAGE) 850 MG tablet Take 1 tablet (850 mg total) by mouth 2 (two) times daily with a meal. 180 tablet 1  . Norethindrone-Ethinyl Estradiol-Fe Biphas (LO LOESTRIN FE) 1 MG-10 MCG / 10 MCG tablet Take 1 tablet by mouth daily. 1 Package 3  . prazosin (MINIPRESS) 1 MG capsule Take 1 mg by mouth at bedtime.    . sertraline (ZOLOFT) 50 MG tablet Take 3 tablets (150 mg total) by mouth daily. 90 tablet 1  . traZODone (DESYREL) 50 MG tablet Take 0.5-1 tablets (25-50 mg total) by mouth at bedtime as needed for sleep. 30 tablet 3  . TRULANCE 3 MG TABS Take 1 tablet by mouth daily. 90 tablet 1   No current facility-administered medications on file prior to visit.    Observations/Objective: NAD. Speaking clearly.  Work of breathing normal.  Alert and oriented. Mood appropriate.   Assessment and Plan: 1. Wears hearing aid in both ears Uses therapy dog to assist with safe crossing.   2. Encounter for completion of form with patient Handicap placard filled out for above disability.  3. Depression with anxiety Appointment scheduled with Jenel Lucks, LCSW for follow up on anxiety and depression per patient request. Continue current pharmacotherapy regimen.     Follow Up Instructions: Behavioral health appointment 8/31   I discussed the assessment and treatment plan with the patient. The patient was provided an opportunity to ask questions and all were answered. The patient agreed with the plan and demonstrated an understanding of the instructions.   The patient was advised to call back or seek an in-person evaluation if the symptoms worsen or if the condition fails to improve as  anticipated.     I provided 12 minutes total of non-face-to-face time during this encounter including median intraservice time, reviewing previous notes, investigations, ordering medications, medical decision making, coordinating care and patient verbalized understanding at the end of the visit.    Marcy Siren, D.O. Primary Care at [facility]  08/01/2020, 2:03 PM

## 2020-08-13 ENCOUNTER — Institutional Professional Consult (permissible substitution): Payer: Medicaid Other | Admitting: Licensed Clinical Social Worker

## 2020-08-13 ENCOUNTER — Other Ambulatory Visit: Payer: Self-pay

## 2020-08-13 ENCOUNTER — Ambulatory Visit (INDEPENDENT_AMBULATORY_CARE_PROVIDER_SITE_OTHER): Payer: Medicaid Other | Admitting: *Deleted

## 2020-08-13 VITALS — BP 123/74 | HR 87 | Temp 98.5°F | Ht 65.0 in | Wt 295.6 lb

## 2020-08-13 DIAGNOSIS — Z793 Long term (current) use of hormonal contraceptives: Secondary | ICD-10-CM

## 2020-08-13 DIAGNOSIS — Z3040 Encounter for surveillance of contraceptives, unspecified: Secondary | ICD-10-CM

## 2020-08-13 DIAGNOSIS — Z0130 Encounter for examination of blood pressure without abnormal findings: Secondary | ICD-10-CM

## 2020-08-13 MED ORDER — NORETHIN-ETH ESTRAD-FE BIPHAS 1 MG-10 MCG / 10 MCG PO TABS: 1.0000 | ORAL_TABLET | Freq: Every day | ORAL | 9 refills | Status: DC

## 2020-08-13 NOTE — Progress Notes (Signed)
   Subjective:  Sara Cervantes is a 19 y.o. female here for BP check and birth control refill.   Hypertension ROS: taking medications as instructed, no medication side effects noted, patient does not perform home BP monitoring, no TIA's, no chest pain on exertion, no dyspnea on exertion, no swelling of ankles, no orthostatic dizziness or lightheadedness, no orthopnea or paroxysmal nocturnal dyspnea and no palpitations.    Objective:  BP 123/74 (BP Location: Left Arm, Patient Position: Sitting, Cuff Size: Large)   Pulse 87   Temp 98.5 F (36.9 C) (Oral)   Ht 5\' 5"  (1.651 m)   Wt 295 lb 9.6 oz (134.1 kg)   LMP 07/15/2020 Comment: pregnancy waiver form signed 07-29-2020  BMI 49.19 kg/m   Appearance alert, well appearing, and in no distress, oriented to person, place, and time and overweight. General exam BP noted to be well controlled today in office.    Assessment:   Blood Pressure well controlled, stable and asymptomatic.   Plan:  Current treatment plan is effective, no change in therapy.  Refill on Lo [place]rin Fe sent to pharmacy.  07-31-2020, RN

## 2020-08-20 ENCOUNTER — Other Ambulatory Visit: Payer: Self-pay

## 2020-08-20 ENCOUNTER — Ambulatory Visit (INDEPENDENT_AMBULATORY_CARE_PROVIDER_SITE_OTHER): Payer: Medicaid Other | Admitting: Licensed Clinical Social Worker

## 2020-08-20 DIAGNOSIS — F411 Generalized anxiety disorder: Secondary | ICD-10-CM

## 2020-08-20 DIAGNOSIS — F331 Major depressive disorder, recurrent, moderate: Secondary | ICD-10-CM

## 2020-09-03 ENCOUNTER — Ambulatory Visit: Payer: Medicaid Other | Admitting: Licensed Clinical Social Worker

## 2020-09-05 ENCOUNTER — Ambulatory Visit (INDEPENDENT_AMBULATORY_CARE_PROVIDER_SITE_OTHER): Payer: Medicaid Other | Admitting: Physician Assistant

## 2020-09-05 ENCOUNTER — Encounter: Payer: Self-pay | Admitting: Physician Assistant

## 2020-09-05 VITALS — BP 118/78 | HR 84 | Ht 65.0 in | Wt 296.0 lb

## 2020-09-05 DIAGNOSIS — R1032 Left lower quadrant pain: Secondary | ICD-10-CM

## 2020-09-05 DIAGNOSIS — D509 Iron deficiency anemia, unspecified: Secondary | ICD-10-CM

## 2020-09-05 DIAGNOSIS — K625 Hemorrhage of anus and rectum: Secondary | ICD-10-CM

## 2020-09-05 MED ORDER — NA SULFATE-K SULFATE-MG SULF 17.5-3.13-1.6 GM/177ML PO SOLN: 1.0000 | Freq: Once | ORAL | 0 refills | Status: AC

## 2020-09-05 MED ORDER — GLYCOPYRROLATE 2 MG PO TABS
2.0000 mg | ORAL_TABLET | Freq: Two times a day (BID) | ORAL | 6 refills | Status: DC | PRN
Start: 2020-09-05 — End: 2021-04-02

## 2020-09-05 MED ORDER — FERROUS SULFATE 325 (65 FE) MG PO TABS: 325.0000 mg | ORAL_TABLET | Freq: Two times a day (BID) | ORAL | 4 refills | Status: DC

## 2020-09-05 NOTE — Patient Instructions (Addendum)
If you are age 20 or older, your body mass index should be between 23-30. Your Body mass index is 49.26 kg/m. If this is out of the aforementioned range listed, please consider follow up with your Primary Care Provider.  If you are age 40 or younger, your body mass index should be between 19-25. Your Body mass index is 49.26 kg/m. If this is out of the aformentioned range listed, please consider follow up with your Primary Care Provider.   You have been scheduled for a colonoscopy. Please follow written instructions given to you at your visit today.  Please pick up your prep supplies at the pharmacy within the next 1-3 days. If you use inhalers (even only as needed), please bring them with you on the day of your procedure.  Continue Glycopyrrolate 2 mg, refills have been sent to your pharmacy.  Try Tylenol 1-2 every 8 hours as needed.  START IRON 325 mg by mouth twice daily. This has been sent to your pharmacy.  Follow up pending the results of your Colonoscopy.

## 2020-09-06 ENCOUNTER — Encounter: Payer: Self-pay | Admitting: Physician Assistant

## 2020-09-06 NOTE — Progress Notes (Signed)
Reviewed and agree with documentation and assessment and plan. K. Veena Keagen Heinlen , MD   

## 2020-09-06 NOTE — Progress Notes (Signed)
Subjective:    Patient ID: Sara Cervantes, female    DOB: 2001-05-21, 19 y.o.   MRN: [id number]  HPI Sara Cervantes is a pleasant 19 year old white female, established at the time of last office visit with Dr. Silverio Decamp  and seen by myself.  She comes in today for follow-up. At the time of initial visit she had described long history of IBS C, also status post laparoscopic cholecystectomy, history of migraines, depression and anxiety. At the time of presentation she was complaining of loose stools, abdominal cramping and bloating and having at least 4-8 bowel movements per day.  She had recently started Metformin.  She had previously been given Trulance which she had continued to take.  That was stopped and we gave her a trial of glycopyrrolate 2 mg p.o. twice daily. CT of the abdomen and pelvis has been done which shows a 3.7 cm left ovarian cyst uncomplicated and a small right renal stone.  Labs showed hemoglobin 11.8 hematocrit of 36.5 MCV of 77 TTG and IgA within normal limits sed rate 33, CRP 1.9. Ferritin 26/serum iron 33/transferrin 425/iron sat 5.5. Patient comes in today stating that she continues to have a lot of abdominal pain discomfort and bloating.  She is complaining of persistent pain now in her left lower abdomen which radiates into her back.  She says this has been bad enough that she has been lying in bed a lot.  Says this has been affecting her appetite and she is having to force herself to eat.  She notices increase in the lower abdominal pain before and with bowel movements.  She is not having any further diarrhea or loose stools, stools are more normal, no melena .  She has on occasion noticed small amounts of blood on the tissue over the past few weeks.  No rectal pain or discomfort.  No known fever or chills. Regarding her anemia we discussed her menses.  She had previously been on Depo injections, then an implant and did not have any menstrual periods over the past couple of years.  Just  over the past 3 months she has been having menses and says that her periods have always been heavy usually lasting 5 days.  Review of Systems Pertinent positive and negative review of systems were noted in the above HPI section.  All other review of systems was otherwise negative.  Outpatient Encounter Medications as of 09/05/2020  Medication Sig  . albuterol (VENTOLIN HFA) 108 (90 Base) MCG/ACT inhaler SMARTSIG:2 Puff(s) By Mouth Every 6 Hours PRN  . eletriptan (RELPAX) 20 MG tablet Take 20 mg by mouth 2 (two) times daily as needed.  . famotidine (PEPCID) 20 MG tablet Take 20 mg by mouth daily.  . fluticasone (FLONASE) 50 MCG/ACT nasal spray Place 2 sprays into the nose daily.   Marland Kitchen glycopyrrolate (ROBINUL) 2 MG tablet Take 1 tablet (2 mg total) by mouth 2 (two) times daily as needed (cramping/bloat).  . hydrOXYzine (ATARAX/VISTARIL) 10 MG tablet Take 10 mg by mouth at bedtime.   . metFORMIN (GLUCOPHAGE) 850 MG tablet Take 1 tablet (850 mg total) by mouth 2 (two) times daily with a meal.  . Norethindrone-Ethinyl Estradiol-Fe Biphas (LO LOESTRIN FE) 1 MG-10 MCG / 10 MCG tablet Take 1 tablet by mouth daily.  . prazosin (MINIPRESS) 1 MG capsule Take 1 mg by mouth at bedtime.  . sertraline (ZOLOFT) 50 MG tablet Take 3 tablets (150 mg total) by mouth daily.  . traZODone (DESYREL) 50 MG tablet Take  0.5-1 tablets (25-50 mg total) by mouth at bedtime as needed for sleep.  . [DISCONTINUED] glycopyrrolate (ROBINUL) 2 MG tablet Take 1 tablet (2 mg total) by mouth 2 (two) times daily as needed (cramping/bloat).  . ferrous sulfate 325 (65 FE) MG tablet Take 1 tablet (325 mg total) by mouth 2 (two) times daily with a meal.  . [EXPIRED] Na Sulfate-K Sulfate-Mg Sulf 17.5-3.13-1.6 GM/177ML SOLN Take 1 kit by mouth once for 1 dose.  . [DISCONTINUED] TRULANCE 3 MG TABS Take 1 tablet by mouth daily.   No facility-administered encounter medications on file as of 09/05/2020.   Allergies  Allergen Reactions  .  Penicillins Anaphylaxis and Hives   Patient Active Problem List   Diagnosis Date Noted  . Prediabetes 05/07/2020  . Bilateral hearing loss 02/06/2019  . Depression with anxiety 11/04/2018  . Environmental and seasonal allergies 11/04/2018  . Exercise-induced asthma 11/04/2018  . Migraine without aura and without status migrainosus, not intractable 11/04/2018  . Obesity, Class III, BMI 40-49.9 (morbid obesity) ([place]) 11/04/2018  . Irritable bowel syndrome 03/03/2018  . S/P laparoscopic cholecystectomy 02/06/2017   Social History   Socioeconomic History  . Marital status: Single    Spouse name: Not on file  . Number of children: 0  . Years of education: Not on file  . Highest education level: Not on file  Occupational History  . Occupation: Ship broker  Tobacco Use  . Smoking status: Never Smoker  . Smokeless tobacco: Never Used  Vaping Use  . Vaping Use: Never used  Substance and Sexual Activity  . Alcohol use: Never  . Drug use: Never  . Sexual activity: Yes    Birth control/protection: Implant  Other Topics Concern  . Not on file  Social History Narrative  . Not on file   Social Determinants of Health   Financial Resource Strain:   . Difficulty of Paying Living Expenses: Not on file  Food Insecurity:   . Worried About Charity fundraiser in the Last Year: Not on file  . Ran Out of Food in the Last Year: Not on file  Transportation Needs:   . Lack of Transportation (Medical): Not on file  . Lack of Transportation (Non-Medical): Not on file  Physical Activity:   . Days of Exercise per Week: Not on file  . Minutes of Exercise per Session: Not on file  Stress:   . Feeling of Stress : Not on file  Social Connections:   . Frequency of Communication with Friends and Family: Not on file  . Frequency of Social Gatherings with Friends and Family: Not on file  . Attends Religious Services: Not on file  . Active Member of Clubs or Organizations: Not on file  . Attends English as a second language teacher Meetings: Not on file  . Marital Status: Not on file  Intimate Partner Violence:   . Fear of Current or Ex-Partner: Not on file  . Emotionally Abused: Not on file  . Physically Abused: Not on file  . Sexually Abused: Not on file    Sara Cervantes's family history includes Breast cancer in her paternal grandmother; COPD in her maternal grandmother; Diabetes in her father; Hypertension in her father and mother; Kidney disease in her father and maternal grandfather; Lung cancer in her maternal grandfather; Other in her brother; Seizures in her brother and maternal grandmother.      Objective:    Vitals:   09/05/20 1401  BP: 118/78  Pulse: 84    Physical  Exam.Well-developed well-nourished young white female in no acute distress.  Height, Weight, 296 BMI 49.2  HEENT; nontraumatic normocephalic, EOMI, PERRLA . Oropharynx; not examined Neck; supple, no JVD Cardiovascular; regular rate and rhythm with S1-S2, no murmur rub or gallop Pulmonary; Clear bilaterally Abdomen; soft, obese, she is tender in the left lower quadrant and left suprapubic area, also tenderness though less marked in the right suprapubic area, nondistended, no palpable mass or hepatosplenomegaly, bowel sounds are active Rectal; not done today Skin; benign exam, no jaundice rash or appreciable lesions Extremities; no clubbing cyanosis or edema skin warm and dry Neuro/Psych; alert and oriented x4, grossly nonfocal mood and affect appropriate       Assessment & Plan:   #56 19 year old white female with previous diagnosis of IBS-C, made elsewhere Patient had previously been on Trulance but presented here last month with complaints of abdominal pain cramping and loose stools over the previous few months.  She has now developed more specific left lower quadrant pain radiating into her back.  She is tender on exam in both the left lower quadrant and right lower quadrant. Loose stools resolved after  discontinuation of Trulance and with use of glycopyrrolate but she is having a significant amount of pain which is affecting her quality of life. She has also noticed some small amounts of blood on the tissue recently.  Recent CT unremarkable other than a uncomplicated physiologic left ovarian cyst measuring 3.7 cm. Etiology of her pain is not clear.  Pain seems out of proportion to what is normally seen with IBS though may be IBS related.  Rule out IBD.  #2 iron deficiency anemia-patient has not been having any menses over the past couple of years while on Depo, so cannot attribute to menorrhagia. We will need to rule out secondary to chronic intermittent GI blood loss and/or malabsorption.  #3 status post laparoscopic cholecystectomy 4.  Depression/anxiety 5.  Morbid obesity  Plan patient will be scheduled for colonoscopy with Dr. Silverio Decamp   Procedure was discussed in detail with patient including indications risks and benefits and she is agreeable to proceed. Patient has completed COVID-19 vaccination. Continue glycopyrrolate 2 mg p.o. twice daily. Suggest that she try regular strength Tylenol 1-2 p.o. every 8 hours for pain Start ferrous sulfate 325 mg p.o. twice daily with food. Further recommendations pending colonoscopy.  Tashira Torre S Cayman Brogden PA-C 09/06/2020   Cc: Caryl Never*

## 2020-09-10 ENCOUNTER — Telehealth (INDEPENDENT_AMBULATORY_CARE_PROVIDER_SITE_OTHER): Payer: Medicaid Other | Admitting: Internal Medicine

## 2020-09-10 DIAGNOSIS — Z113 Encounter for screening for infections with a predominantly sexual mode of transmission: Secondary | ICD-10-CM | POA: Diagnosis not present

## 2020-09-10 DIAGNOSIS — F418 Other specified anxiety disorders: Secondary | ICD-10-CM

## 2020-09-10 MED ORDER — BUSPIRONE HCL 5 MG PO TABS: 5.0000 mg | ORAL_TABLET | Freq: Two times a day (BID) | ORAL | 1 refills | Status: DC

## 2020-09-10 NOTE — Progress Notes (Signed)
Virtual Visit via Telephone Note  I connected with Sara Cervantes, on 09/10/2020 at 2:00 PM by telephone due to the COVID-19 pandemic and verified that I am speaking with the correct person using two identifiers.   Consent: I discussed the limitations, risks, security and privacy concerns of performing an evaluation and management service by telephone and the availability of in person appointments. I also discussed with the patient that there may be a patient responsible charge related to this service. The patient expressed understanding and agreed to proceed.   Location of Patient: Home   Location of Provider: Clinic    Persons participating in Telemedicine visit: Dhwani Venkatesh [facility] Dr. Earlene Plater      History of Present Illness: Patient has a visit to f/u on depression and anxiety. Anxiety has been very uncontrolled recently. Says that she will create sores on her skin from constant picking and itching. Feels like she over worries about little things. Depression has worsened. Taking Zoloft 150 mg daily. Feels like maybe going back to school has added to her stress. Course load isn't significant, but she is in a new major.   Also requests STD screening.   Depression screen [facility] 2/9 09/10/2020 05/24/2020 05/07/2020 04/26/2020  Decreased Interest 2 1 2  0  Down, Depressed, Hopeless 2 2 2 1   PHQ - 2 Score 4 3 4 1   Altered sleeping 3 3 2 1   Tired, decreased energy 3 2 2 2   Change in appetite 2 0 1 1  Feeling bad or failure about yourself  2 0 1 0  Trouble concentrating 1 0 1 0  Moving slowly or fidgety/restless 1 0 0 0  Suicidal thoughts 1 0 0 0  PHQ-9 Score 17 8 11 5   Difficult doing work/chores Very difficult - - -   GAD 7 : Generalized Anxiety Score 09/10/2020 05/24/2020 05/07/2020 04/26/2020  Nervous, Anxious, on Edge 2 1 3  0  Control/stop worrying 2 0 3 0  Worry too much - different things 2 0 3 0  Trouble relaxing 2 0 3 1  Restless 1 1 0 0  Easily annoyed or  irritable 2 1 1  0  Afraid - awful might happen 2 0 2 0  Total GAD 7 Score 13 3 15 1   Anxiety Difficulty - - - Not difficult at all      Past Medical History:  Diagnosis Date  . Allergy    Phreesia 07/07/2020  . Anxiety    Phreesia 07/07/2020  . Asthma    Phreesia 07/07/2020  . Cardiac arrhythmia   . Depression with anxiety   . Diabetes mellitus without complication (HCC)    Phreesia 07/07/2020  . Exercise-induced asthma   . Hearing deficit, bilateral   . IBS (irritable bowel syndrome)   . Migraines   . Morbid obesity (HCC)   . Prediabetes   . S/P laparoscopic cholecystectomy   . Seasonal allergies    Allergies  Allergen Reactions  . Penicillins Anaphylaxis and Hives    Current Outpatient Medications on File Prior to Visit  Medication Sig Dispense Refill  . albuterol (VENTOLIN HFA) 108 (90 Base) MCG/ACT inhaler SMARTSIG:2 Puff(s) By Mouth Every 6 Hours PRN    . eletriptan (RELPAX) 20 MG tablet Take 20 mg by mouth 2 (two) times daily as needed.    . famotidine (PEPCID) 20 MG tablet Take 20 mg by mouth daily.    . ferrous sulfate 325 (65 FE) MG tablet Take 1 tablet (325 mg total) by mouth  2 (two) times daily with a meal. 60 tablet 4  . fluticasone (FLONASE) 50 MCG/ACT nasal spray Place 2 sprays into the nose daily.     Marland Kitchen glycopyrrolate (ROBINUL) 2 MG tablet Take 1 tablet (2 mg total) by mouth 2 (two) times daily as needed (cramping/bloat). 60 tablet 6  . hydrOXYzine (ATARAX/VISTARIL) 10 MG tablet Take 10 mg by mouth at bedtime.     . metFORMIN (GLUCOPHAGE) 850 MG tablet Take 1 tablet (850 mg total) by mouth 2 (two) times daily with a meal. 180 tablet 1  . Norethindrone-Ethinyl Estradiol-Fe Biphas (LO LOESTRIN FE) 1 MG-10 MCG / 10 MCG tablet Take 1 tablet by mouth daily. 28 tablet 9  . prazosin (MINIPRESS) 1 MG capsule Take 1 mg by mouth at bedtime.    . sertraline (ZOLOFT) 50 MG tablet Take 3 tablets (150 mg total) by mouth daily. 90 tablet 1  . traZODone (DESYREL) 50 MG  tablet Take 0.5-1 tablets (25-50 mg total) by mouth at bedtime as needed for sleep. 30 tablet 3   No current facility-administered medications on file prior to visit.    Observations/Objective: NAD. Speaking clearly.  Work of breathing normal.  Alert and oriented. Mood appropriate.   Assessment and Plan: 1. Depression with anxiety GAD-7 and PHQ-9 greatly worsened. Anxiety symptoms dominate per patient. Will start Buspar 5 mg BID. Continue Zoloft at current dose. Passive suicidal thoughts without active plan. Denies safety concerns. Follow up scheduled with Jenel Lucks, LCSW virtually. Does not want to establish with therapist due to transportation issues at present. Return with this provider in 6 weeks.  - busPIRone (BUSPAR) 5 MG tablet; Take 1 tablet (5 mg total) by mouth 2 (two) times daily.  Dispense: 60 tablet; Refill: 1  2. Screening for STD (sexually transmitted disease) - HIV Antibody (routine testing w rflx); Future - RPR; Future - Cervicovaginal ancillary only; Future   Follow Up Instructions: Lab visit 10/11; Visit with Jenel Lucks, LCSW 10/14; 6 week f/u PCP    I discussed the assessment and treatment plan with the patient. The patient was provided an opportunity to ask questions and all were answered. The patient agreed with the plan and demonstrated an understanding of the instructions.   The patient was advised to call back or seek an in-person evaluation if the symptoms worsen or if the condition fails to improve as anticipated.     I provided 18 minutes total of non-face-to-face time during this encounter including median intraservice time, reviewing previous notes, investigations, ordering medications, medical decision making, coordinating care and patient verbalized understanding at the end of the visit.    Marcy Siren, D.O. Primary Care at [facility]  09/10/2020, 2:00 PM

## 2020-09-12 ENCOUNTER — Other Ambulatory Visit: Payer: Self-pay

## 2020-09-12 ENCOUNTER — Encounter: Payer: Self-pay | Admitting: Gastroenterology

## 2020-09-12 ENCOUNTER — Ambulatory Visit (AMBULATORY_SURGERY_CENTER): Payer: Medicaid Other | Admitting: Gastroenterology

## 2020-09-12 VITALS — BP 125/84 | HR 70 | Temp 98.0°F | Resp 21 | Ht 65.0 in | Wt 296.0 lb

## 2020-09-12 DIAGNOSIS — K5289 Other specified noninfective gastroenteritis and colitis: Secondary | ICD-10-CM | POA: Diagnosis not present

## 2020-09-12 DIAGNOSIS — R1032 Left lower quadrant pain: Secondary | ICD-10-CM

## 2020-09-12 DIAGNOSIS — K649 Unspecified hemorrhoids: Secondary | ICD-10-CM

## 2020-09-12 MED ORDER — SODIUM CHLORIDE 0.9 % IV SOLN: 500.0000 mL | INTRAVENOUS | Status: DC

## 2020-09-12 NOTE — Patient Instructions (Signed)
YOU HAD AN ENDOSCOPIC PROCEDURE TODAY AT THE [facility]:   Refer to the procedure report that was given to you for any specific questions about what was found during the examination.  If the procedure report does not answer your questions, please call your gastroenterologist to clarify.  If you requested that your care partner not be given the details of your procedure findings, then the procedure report has been included in a sealed envelope for you to review at your convenience later.  YOU SHOULD EXPECT: Some feelings of bloating in the abdomen. Passage of more gas than usual.  Walking can help get rid of the air that was put into your GI tract during the procedure and reduce the bloating. If you had a lower endoscopy (such as a colonoscopy or flexible sigmoidoscopy) you may notice spotting of blood in your stool or on the toilet paper. If you underwent a bowel prep for your procedure, you may not have a normal bowel movement for a few days.  Please Note:  You might notice some irritation and congestion in your nose or some drainage.  This is from the oxygen used during your procedure.  There is no need for concern and it should clear up in a day or so.  SYMPTOMS TO REPORT IMMEDIATELY:   Following lower endoscopy (colonoscopy or flexible sigmoidoscopy):  Excessive amounts of blood in the stool  Significant tenderness or worsening of abdominal pains  Swelling of the abdomen that is new, acute  Fever of 100F or higher  For urgent or emergent issues, a gastroenterologist can be reached at any hour by calling [phone]. Do not use MyChart messaging for urgent concerns.    DIET:  We do recommend a small meal at first, but then you may proceed to your regular diet.  Drink plenty of fluids but you should avoid alcoholic beverages for 24 hours.  ACTIVITY:  You should plan to take it easy for the rest of today and you should NOT DRIVE or use heavy machinery until tomorrow (because  of the sedation medicines used during the test).    FOLLOW UP: Our staff will call the number listed on your records 48-72 hours following your procedure to check on you and address any questions or concerns that you may have regarding the information given to you following your procedure. If we do not reach you, we will leave a message.  We will attempt to reach you two times.  During this call, we will ask if you have developed any symptoms of COVID 19. If you develop any symptoms (ie: fever, flu-like symptoms, shortness of breath, cough etc.) before then, please call [phone].  If you test positive for Covid 19 in the 2 weeks post procedure, please call and report this information to us.    If any biopsies were taken you will be contacted by phone or by letter within the next 1-3 weeks.  Please call us at [phone] if you have not heard about the biopsies in 3 weeks.    SIGNATURES/CONFIDENTIALITY: You and/or your care partner have signed paperwork which will be entered into your electronic medical record.  These signatures attest to the fact that that the information above on your After Visit Summary has been reviewed and is understood.  Full responsibility of the confidentiality of this discharge information lies with you and/or your care-partner. 

## 2020-09-12 NOTE — Progress Notes (Signed)
Vitals by CW 

## 2020-09-12 NOTE — Progress Notes (Signed)
Called to room to assist during endoscopic procedure.  Patient ID and intended procedure confirmed with present staff. Received instructions for my participation in the procedure from the performing physician.  

## 2020-09-12 NOTE — Op Note (Signed)
[facility] Patient Name: Sara Cervantes Procedure Date: 09/12/2020 11:10 AM MRN: [id number] Endoscopist: Napoleon Form , MD Age: 19 Referring MD:  Date of Birth: 2000/12/10 Gender: Female Account #: [id number] Procedure:                Colonoscopy Indications:              Clinically significant diarrhea of unexplained                            origin, Generalized abdominal pain Medicines:                Monitored Anesthesia Care Procedure:                Pre-Anesthesia Assessment:                           - Prior to the procedure, a History and Physical                            was performed, and patient medications and                            allergies were reviewed. The patient's tolerance of                            previous anesthesia was also reviewed. The risks                            and benefits of the procedure and the sedation                            options and risks were discussed with the patient.                            All questions were answered, and informed consent                            was obtained. Prior Anticoagulants: The patient has                            taken no previous anticoagulant or antiplatelet                            agents. ASA Grade Assessment: II - A patient with                            mild systemic disease. After reviewing the risks                            and benefits, the patient was deemed in                            satisfactory condition to undergo the procedure.  After obtaining informed consent, the colonoscope                            was passed under direct vision. Throughout the                            procedure, the patient's blood pressure, pulse, and                            oxygen saturations were monitored continuously. The                            Colonoscope was introduced through the anus and                            advanced to the the  cecum, identified by                            appendiceal orifice and ileocecal valve. The                            colonoscopy was performed without difficulty. The                            patient tolerated the procedure well. The quality                            of the bowel preparation was excellent. The                            terminal ileum, ileocecal valve, appendiceal                            orifice, and rectum were photographed. Scope In: 11:27:09 AM Scope Out: 11:39:29 AM Scope Withdrawal Time: 0 hours 9 minutes 44 seconds  Total Procedure Duration: 0 hours 12 minutes 20 seconds  Findings:                 The perianal and digital rectal examinations were                            normal.                           Normal mucosa was found in the entire colon.                            Biopsies for histology were taken with a cold                            forceps from the right colon and left colon for                            evaluation of microscopic colitis.  Non-bleeding internal hemorrhoids were found during                            retroflexion. The hemorrhoids were small.                           The exam was otherwise without abnormality. Complications:            No immediate complications. Estimated Blood Loss:     Estimated blood loss was minimal. Impression:               - Normal mucosa in the entire examined colon.                            Biopsied.                           - Non-bleeding internal hemorrhoids.                           - The examination was otherwise normal. Recommendation:           - Patient has a contact number available for                            emergencies. The signs and symptoms of potential                            delayed complications were discussed with the                            patient. Return to normal activities tomorrow.                            Written discharge  instructions were provided to the                            patient.                           - Resume previous diet.                           - Continue present medications.                           - Await pathology results.                           - Repeat colonoscopy date to be determined after                            pending pathology results are reviewed for                            surveillance based on pathology results. Napoleon Form, MD 09/12/2020 11:44:53 AM This report has been signed electronically.

## 2020-09-12 NOTE — Progress Notes (Signed)
A/ox3, pleased with MAC, report to RN 

## 2020-09-16 ENCOUNTER — Telehealth: Payer: Self-pay

## 2020-09-16 ENCOUNTER — Telehealth: Payer: Self-pay | Admitting: *Deleted

## 2020-09-16 ENCOUNTER — Other Ambulatory Visit (INDEPENDENT_AMBULATORY_CARE_PROVIDER_SITE_OTHER): Payer: Medicaid Other

## 2020-09-16 ENCOUNTER — Other Ambulatory Visit (HOSPITAL_COMMUNITY)
Admission: RE | Admit: 2020-09-16 | Discharge: 2020-09-16 | Disposition: A | Discharge status: Home / Self Care | Payer: Medicaid Other | Source: Ambulatory Visit | Attending: Internal Medicine | Admitting: Internal Medicine

## 2020-09-16 ENCOUNTER — Other Ambulatory Visit: Payer: Self-pay

## 2020-09-16 DIAGNOSIS — Z113 Encounter for screening for infections with a predominantly sexual mode of transmission: Secondary | ICD-10-CM

## 2020-09-16 NOTE — BH Specialist Note (Addendum)
Integrated Behavioral Health Initial Visit  MRN: [id number] Name: Sara Cervantes  Number of Integrated Behavioral Health Clinician visits:: 1/6 Session Start time: 1:40 PM  Session End time: 2:20 PM Total time: 40   Type of Service: Integrated Behavioral Health- Individual Interpretor:No. Interpretor Name and Language: NA   SUBJECTIVE: Sara Cervantes is a 19 y.o. female accompanied by self Patient was referred by Dr. Earlene Plater for depression and anxiety. 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 Reduce symptoms of: anxiety and depression  Pt agreed to continue taking prescribed medications daily 2. Increase knowledge and/or ability of: stress reduction        Pt agreed to exercise for 15-30 min 3 times a week       Pt agreed to set a routine schedule to assist with studying       that utilizes phone, sticky notes, and assignment planner    INTERVENTIONS: Interventions utilized: Solution-Focused Strategies  Standardized Assessments completed: GAD-7 and PHQ 2&9  ASSESSMENT: Patient currently experiencing difficulty managing depression and anxiety symptoms triggered by ongoing stress. Pt reports successfulness in establishing healthy boundaries with loved ones and prioritizing sleep to improve self-care. She reports compliance with medications noting no  side-effects. Feels they are helping.   Patient may benefit from continued therapy and medication management to assist with the management of depression and anxiety symptoms.  PLAN: 1. Follow up with behavioral health clinician on : 09/19/2020 2. Behavioral recommendations: Utilize strategies discussed and continue with medication management 3. Referral(s): Integrated Behavioral Health Services (In Clinic) 4. "From scale of 1-10, how likely are you to follow plan?":   Bridgett Larsson, LCSW 09/16/2020 8:24 AM

## 2020-09-16 NOTE — Telephone Encounter (Signed)
Could not leave VM

## 2020-09-16 NOTE — Telephone Encounter (Signed)
  Follow up Call-  Call back number 09/12/2020  Post procedure Call Back phone  # [phone]  Permission to leave phone message Yes     Patient questions:  Do you have a fever, pain , or abdominal swelling? No. Pain Score  0 *  Have you tolerated food without any problems? Yes.    Have you been able to return to your normal activities? Yes.    Do you have any questions about your discharge instructions: Diet   No. Medications  No. Follow up visit  No.  Do you have questions or concerns about your Care? No.  Actions: * If pain score is 4 or above: No action needed, pain <4.  1. Have you developed a fever since your procedure? no  2.   Have you had an respiratory symptoms (SOB or cough) since your procedure? no  3.   Have you tested positive for COVID 19 since your procedure no  4.   Have you had any family members/close contacts diagnosed with the COVID 19 since your procedure?  no   If yes to any of these questions please route to Laverna Peace, RN and Karlton Lemon, RN

## 2020-09-17 LAB — HIV ANTIBODY (ROUTINE TESTING W REFLEX): HIV Screen 4th Generation wRfx: NONREACTIVE

## 2020-09-17 LAB — RPR: RPR Ser Ql: NONREACTIVE

## 2020-09-18 ENCOUNTER — Ambulatory Visit (INDEPENDENT_AMBULATORY_CARE_PROVIDER_SITE_OTHER): Payer: Medicaid Other | Admitting: Certified Nurse Midwife

## 2020-09-18 ENCOUNTER — Encounter: Payer: Self-pay | Admitting: Certified Nurse Midwife

## 2020-09-18 ENCOUNTER — Other Ambulatory Visit: Payer: Self-pay

## 2020-09-18 ENCOUNTER — Other Ambulatory Visit (HOSPITAL_COMMUNITY)
Admission: RE | Admit: 2020-09-18 | Discharge: 2020-09-18 | Disposition: A | Discharge status: Home / Self Care | Payer: Medicaid Other | Source: Ambulatory Visit | Attending: Certified Nurse Midwife | Admitting: Certified Nurse Midwife

## 2020-09-18 VITALS — BP 116/74 | HR 80 | Temp 98.2°F | Ht 65.0 in | Wt 294.4 lb

## 2020-09-18 DIAGNOSIS — B373 Candidiasis of vulva and vagina: Secondary | ICD-10-CM | POA: Diagnosis not present

## 2020-09-18 DIAGNOSIS — N898 Other specified noninflammatory disorders of vagina: Secondary | ICD-10-CM | POA: Insufficient documentation

## 2020-09-18 DIAGNOSIS — R300 Dysuria: Secondary | ICD-10-CM | POA: Diagnosis not present

## 2020-09-18 DIAGNOSIS — B3731 Acute candidiasis of vulva and vagina: Secondary | ICD-10-CM

## 2020-09-18 LAB — CERVICOVAGINAL ANCILLARY ONLY
Chlamydia: NEGATIVE
Comment: NEGATIVE
Comment: NEGATIVE
Comment: NORMAL
Neisseria Gonorrhea: NEGATIVE
Trichomonas: NEGATIVE

## 2020-09-18 LAB — POCT URINALYSIS DIPSTICK (MANUAL)
Nitrite, UA: NEGATIVE
Poct Bilirubin: NEGATIVE
Poct Glucose: NORMAL mg/dL
Poct Ketones: NEGATIVE
Poct Protein: 30 mg/dL — AB
Poct Urobilinogen: NORMAL mg/dL
Spec Grav, UA: >=1.03 — AB (ref 1.010–1.025)
pH, UA: 6 (ref 5.0–8.0)

## 2020-09-18 MED ORDER — MICONAZOLE NITRATE 2 % VA CREA: 1.0000 | TOPICAL_CREAM | Freq: Every day | VAGINAL | 2 refills | Status: DC

## 2020-09-18 MED ORDER — MICONAZOLE NITRATE 2 % EX CREA: 1.0000 "application " | TOPICAL_CREAM | Freq: Two times a day (BID) | CUTANEOUS | 0 refills | Status: DC

## 2020-09-18 NOTE — Progress Notes (Signed)
History:  Ms. Sara Cervantes is a 19 y.o. G0P0000 who presents to clinic today for vaginal irritation, itching, and discharge. She had a colonoscopy on Friday and then noticed her vulva was irritated and itchy, and then by Sunday she noticed excess creamy white discharge.   The following portions of the patient's history were reviewed and updated as appropriate: allergies, current medications, family history, past medical history, social history, past surgical history and problem list.  Review of Systems:  Review of Systems  Constitutional: Negative for fever.  Gastrointestinal: Negative for abdominal pain.  Genitourinary: Positive for dysuria (burning with urination). Negative for flank pain, frequency, hematuria and urgency.  Skin: Positive for itching and rash (on vulva).  All other systems reviewed and are negative.     Objective:  Physical Exam BP 116/74 (BP Location: Left Arm, Patient Position: Sitting, Cuff Size: Large)   Pulse 80   Temp 98.2 F (36.8 C) (Oral)   Ht 5\' 5"  (1.651 m)   Wt 294 lb 6.4 oz (133.5 kg)   LMP 09/10/2020   BMI 48.99 kg/m  Physical Exam Vitals and nursing note reviewed. Exam conducted with a chaperone present.  Constitutional:      Appearance: Normal appearance. She is normal weight.  Eyes:     Pupils: Pupils are equal, round, and reactive to light.  Cardiovascular:     Rate and Rhythm: Normal rate and regular rhythm.     Pulses: Normal pulses.  Pulmonary:     Effort: Pulmonary effort is normal.  Genitourinary:    Vagina: Vaginal discharge present.     Comments: Reddened area and rash noted on vulva and inner thighs. Small clumps of white discharge noted on labia majora, thick creamy white discharge noted coming from vaginal opening. Swabs obtained. Musculoskeletal:        General: Normal range of motion.  Skin:    General: Skin is warm and dry.  Neurological:     Mental Status: She is alert.    Labs and Imaging Results for orders placed  or performed in visit on 09/18/20 (from the past 24 hour(s))  POCT Urinalysis Dip Manual     Status: Abnormal   Collection Time: 09/18/20 10:25 AM  Result Value Ref Range   Spec Grav, UA >=1.030 (A) 1.010 - 1.025   pH, UA 6.0 5.0 - 8.0   Leukocytes, UA Large (3+) (A) Negative   Nitrite, UA Negative Negative   Poct Protein +30 (A) Negative, trace mg/dL   Poct Glucose Normal Normal mg/dL   Poct Ketones Negative Negative   Poct Urobilinogen Normal Normal mg/dL   Poct Bilirubin Negative Negative   Poct Blood trace Negative, trace   Assessment & Plan:  1. Vaginal itching & discharge - Cervicovaginal ancillary only( [place])  2. Dysuria - Urine Culture - will wait for results and treat as appropriate, burning is likely due to yeast infection - POCT Urinalysis Dip Manual  3. Vulvovaginitis due to yeast - Advised to wash underwear in hot water with white vinegar added to washer - miconazole (MONISTAT 7) 2 % vaginal cream; Place 1 Applicatorful vaginally at bedtime. Apply for seven nights  Dispense: 30 g; Refill: 2 - miconazole (MICONAZOLE ANTIFUNGAL) 2 % cream; Apply 1 application topically 2 (two) times daily.  Dispense: 28.35 g; Refill: 0  Follow up PRN if condition worsens or fails to improve.  09/20/20, CNM, MSN, [facility] 09/18/20 10:35 AM

## 2020-09-18 NOTE — Patient Instructions (Signed)
Vaginal Yeast Infection, Adult  Vaginal yeast infection is a condition that causes vaginal discharge as well as soreness, swelling, and redness (inflammation) of the vagina. This is a common condition. Some women get this infection frequently. What are the causes? This condition is caused by a change in the normal balance of the yeast (candida) and bacteria that live in the vagina. This change causes an overgrowth of yeast, which causes the inflammation. What increases the risk? The condition is more likely to develop in women who:  Take antibiotic medicines.  Have diabetes.  Take birth control pills.  Are pregnant.  Douche often.  Have a weak body defense system (immune system).  Have been taking steroid medicines for a long time.  Frequently wear tight clothing. What are the signs or symptoms? Symptoms of this condition include:  White, thick, creamy vaginal discharge.  Swelling, itching, redness, and irritation of the vagina. The lips of the vagina (vulva) may be affected as well.  Pain or a burning feeling while urinating.  Pain during sex. How is this diagnosed? This condition is diagnosed based on:  Your medical history.  A physical exam.  A pelvic exam. Your health care provider will examine a sample of your vaginal discharge under a microscope. Your health care provider may send this sample for testing to confirm the diagnosis. How is this treated? This condition is treated with medicine. Medicines may be over-the-counter or prescription. You may be told to use one or more of the following:  Medicine that is taken by mouth (orally).  Medicine that is applied as a cream (topically).  Medicine that is inserted directly into the vagina (suppository). Follow these instructions at home:  Lifestyle  Do not have sex until your health care provider approves. Tell your sex partner that you have a yeast infection. That person should go to his or her health care  provider and ask if they should also be treated.  Do not wear tight clothes, such as pantyhose or tight pants.  Wear breathable cotton underwear. General instructions  Take or apply over-the-counter and prescription medicines only as told by your health care provider.  Eat more yogurt. This may help to keep your yeast infection from returning.  Do not use tampons until your health care provider approves.  Try taking a sitz bath to help with discomfort. This is a warm water bath that is taken while you are sitting down. The water should only come up to your hips and should cover your buttocks. Do this 3-4 times per day or as told by your health care provider.  Do not douche.  If you have diabetes, keep your blood sugar levels under control.  Keep all follow-up visits as told by your health care provider. This is important. Contact a health care provider if:  You have a fever.  Your symptoms go away and then return.  Your symptoms do not get better with treatment.  Your symptoms get worse.  You have new symptoms.  You develop blisters in or around your vagina.  You have blood coming from your vagina and it is not your menstrual period.  You develop pain in your abdomen. Summary  Vaginal yeast infection is a condition that causes discharge as well as soreness, swelling, and redness (inflammation) of the vagina.  This condition is treated with medicine. Medicines may be over-the-counter or prescription.  Take or apply over-the-counter and prescription medicines only as told by your health care provider.  Do not douche.   Do not have sex or use tampons until your health care provider approves.  Contact a health care provider if your symptoms do not get better with treatment or your symptoms go away and then return. This information is not intended to replace advice given to you by your health care provider. Make sure you discuss any questions you have with your health care  provider. Document Revised: 06/23/2019 Document Reviewed: 04/11/2018 Elsevier Patient Education  2020 Elsevier Inc.  

## 2020-09-19 ENCOUNTER — Ambulatory Visit: Payer: Medicaid Other | Attending: Internal Medicine | Admitting: Licensed Clinical Social Worker

## 2020-09-19 DIAGNOSIS — F411 Generalized anxiety disorder: Secondary | ICD-10-CM

## 2020-09-19 DIAGNOSIS — F331 Major depressive disorder, recurrent, moderate: Secondary | ICD-10-CM

## 2020-09-19 LAB — CERVICOVAGINAL ANCILLARY ONLY
Bacterial Vaginitis (gardnerella): POSITIVE — AB
Candida Glabrata: NEGATIVE
Candida Vaginitis: POSITIVE — AB
Chlamydia: NEGATIVE
Comment: NEGATIVE
Comment: NEGATIVE
Comment: NEGATIVE
Comment: NEGATIVE
Comment: NEGATIVE
Comment: NORMAL
Neisseria Gonorrhea: NEGATIVE
Trichomonas: NEGATIVE

## 2020-09-19 NOTE — Progress Notes (Signed)
Integrated Behavioral Health Visit via Telemedicine (Telephone)  09/19/2020 Sara Cervantes [id number]  Number of Integrated Behavioral Health visits: 2 Session Start time: 10:40 AM  Session End time: 11:00 AM Total time: 20 minutes  Referring Provider: Dr. Earlene Plater Type of Service: Individual Patient location: Home [facility] Provider location: Office All persons participating in visit: LCSW and patient   I connected with Sara Cervantes by telephone and verified that I am speaking with the correct person using two identifiers.   Discussed confidentiality: Yes   Confirmed demographics & insurance:  Yes   I discussed that engaging in this virtual visit, they consent to the provision of behavioral healthcare and the services will be billed under their insurance.   Patient and/or legal guardian expressed understanding and consented to virtual visit: Yes   PRESENTING CONCERNS: Patient or family reports the following symptoms/concerns: Pt reports decrease in depression and anxiety symptoms; however, shared ongoing stressors regarding balancing school and medical health. She is concerned about diabetes Duration of problem: Ongoing; Severity of problem: moderate  STRENGTHS (Protective Factors/Coping Skills): Social connections, Social and Emotional competence, Concrete supports in place (healthy food, safe environments, etc.) and Sense of purpose  ASSESSMENT: Patient currently experiencing symptoms of depression and anxiety. Symptoms have decreased as evidenced by current phq9 and gad7 scores. Pt denies current suicidal/homicial ideations.  Pt will benefit from continued medication management and therapy.    GOALS ADDRESSED: Patient will: 1.  Reduce symptoms of: anxiety, depression and stress Pt agreed to continue taking prescribed medications 2.  Increase knowledge and/or ability of: healthy habits and stress reduction Pt agreed to continue walking and increase intake of fruits and  veggies              Pt agreed to meet with Academic Advisor to        strengthen academic support  Progress of Goals: Ongoing  INTERVENTIONS: Interventions utilized:  Solution-Focused Strategies, Supportive Counseling and Psychoeducation and/or Health Education Standardized Assessments completed & reviewed: GAD-7 and PHQ 2&9   OUTCOME: Patient Response: Pt was engaged during session and identified appropriate goals to assist in management and/or decrease of symptoms.    PLAN: 1. Follow up with behavioral health clinician on : 10/08/2020 2. Behavioral recommendations: Utilize strategies and continue with medication management 3. Referral(s): Integrated [employer] (In Clinic)  I discussed the assessment and treatment plan with the patient and/or parent/guardian. They were provided an opportunity to ask questions and all were answered. They agreed with the plan and demonstrated an understanding of the instructions.   They were advised to call back or seek an in-person evaluation as appropriate.  I discussed that the purpose of this visit is to provide behavioral health care while limiting exposure to the novel coronavirus.  Discussed there is a possibility of technology failure and discussed alternative modes of communication if that failure occurs.  Bridgett Larsson, LCSW 10/11/2020 7:11 AM

## 2020-09-20 ENCOUNTER — Ambulatory Visit: Payer: Medicaid Other

## 2020-09-20 LAB — URINE CULTURE

## 2020-09-21 ENCOUNTER — Ambulatory Visit: Payer: Medicaid Other

## 2020-09-24 ENCOUNTER — Encounter: Payer: Self-pay | Admitting: Gastroenterology

## 2020-09-27 ENCOUNTER — Other Ambulatory Visit: Payer: Self-pay | Admitting: Pharmacist

## 2020-09-27 ENCOUNTER — Telehealth: Payer: Self-pay | Admitting: Internal Medicine

## 2020-09-27 DIAGNOSIS — R7303 Prediabetes: Secondary | ICD-10-CM

## 2020-09-27 MED ORDER — ACCU-CHEK SOFTCLIX LANCETS MISC: 2 refills | Status: AC

## 2020-09-27 MED ORDER — ACCU-CHEK GUIDE ME W/DEVICE KIT: PACK | 0 refills | Status: AC

## 2020-09-27 MED ORDER — ACCU-CHEK GUIDE VI STRP: ORAL_STRIP | 2 refills | Status: AC

## 2020-09-27 NOTE — Telephone Encounter (Signed)
Spoke with the Pt schedule a financial appt for 10/07/20

## 2020-09-27 NOTE — Telephone Encounter (Signed)
Copied from CRM [id number]. Topic: General - Other >> Sep 18, 2020  2:31 PM Dalphine Handing A wrote: Patient would like a callback from [facility] to get orange card.  Best contact [phone]

## 2020-10-04 ENCOUNTER — Other Ambulatory Visit: Payer: Self-pay

## 2020-10-04 DIAGNOSIS — F418 Other specified anxiety disorders: Secondary | ICD-10-CM

## 2020-10-04 MED ORDER — SERTRALINE HCL 50 MG PO TABS: 150.0000 mg | ORAL_TABLET | Freq: Every day | ORAL | 1 refills | Status: DC

## 2020-10-07 ENCOUNTER — Ambulatory Visit: Payer: Medicaid Other

## 2020-10-08 ENCOUNTER — Other Ambulatory Visit: Payer: Medicaid Other

## 2020-10-08 ENCOUNTER — Ambulatory Visit: Payer: Medicaid Other | Admitting: Licensed Clinical Social Worker

## 2020-10-08 ENCOUNTER — Other Ambulatory Visit: Payer: Self-pay

## 2020-10-08 DIAGNOSIS — Z20822 Contact with and (suspected) exposure to covid-19: Secondary | ICD-10-CM

## 2020-10-09 LAB — SARS-COV-2, NAA 2 DAY TAT

## 2020-10-09 LAB — NOVEL CORONAVIRUS, NAA: SARS-CoV-2, NAA: NOT DETECTED

## 2020-10-17 ENCOUNTER — Telehealth: Payer: Self-pay | Admitting: Licensed Clinical Social Worker

## 2020-10-17 ENCOUNTER — Ambulatory Visit: Payer: Medicaid Other | Admitting: Licensed Clinical Social Worker

## 2020-10-17 NOTE — Telephone Encounter (Signed)
LCSW placed call to patient regarding IBH appointment. LCSW left message requesting a return call.  

## 2020-10-28 ENCOUNTER — Encounter: Payer: Self-pay | Admitting: Internal Medicine

## 2020-10-28 ENCOUNTER — Telehealth (INDEPENDENT_AMBULATORY_CARE_PROVIDER_SITE_OTHER): Payer: Medicaid Other | Admitting: Internal Medicine

## 2020-10-28 DIAGNOSIS — F418 Other specified anxiety disorders: Secondary | ICD-10-CM | POA: Diagnosis not present

## 2020-10-28 DIAGNOSIS — Z8349 Family history of other endocrine, nutritional and metabolic diseases: Secondary | ICD-10-CM | POA: Diagnosis not present

## 2020-10-28 DIAGNOSIS — E282 Polycystic ovarian syndrome: Secondary | ICD-10-CM | POA: Insufficient documentation

## 2020-10-28 DIAGNOSIS — G4700 Insomnia, unspecified: Secondary | ICD-10-CM

## 2020-10-28 MED ORDER — SERTRALINE HCL 50 MG PO TABS: 150.0000 mg | ORAL_TABLET | Freq: Every day | ORAL | 1 refills | Status: AC

## 2020-10-28 MED ORDER — TRAZODONE HCL 50 MG PO TABS: 25.0000 mg | ORAL_TABLET | Freq: Every evening | ORAL | 1 refills | Status: AC | PRN

## 2020-10-28 MED ORDER — BUSPIRONE HCL 5 MG PO TABS: 5.0000 mg | ORAL_TABLET | Freq: Two times a day (BID) | ORAL | 1 refills | Status: AC

## 2020-10-28 NOTE — Progress Notes (Signed)
Virtual Visit via Telephone Note  I connected with Sara Cervantes, on 10/28/2020 at 2:19 PM by telephone due to the COVID-19 pandemic and verified that I am speaking with the correct person using two identifiers.   Consent: I discussed the limitations, risks, security and privacy concerns of performing an evaluation and management service by telephone and the availability of in person appointments. I also discussed with the patient that there may be a patient responsible charge related to this service. The patient expressed understanding and agreed to proceed.   Location of Patient: Home   Location of Provider: Clinic    Persons participating in Telemedicine visit: Shaleena Crusoe [facility] Dr. Juleen China      History of Present Illness: Patient has a visit to follow up on anxiety and depression. Was started on Buspar at last visit and had f/u with Christa See, LCSW. Reports compliance with her medications. Reports that she is feeling a lot better. Feeling more herself with increased happiness and noting that she feels more outgoing. Feels like symptoms are well controlled on current doses of medications. Doesn't want to make any medication changes. She has been coping by getting outside and going for walks. Has follow up with [facility] after Thanksgiving.   Depression screen [facility] 2/9 10/28/2020 09/19/2020 09/10/2020  Decreased Interest 1 0 2  Down, Depressed, Hopeless '1 1 2  ' PHQ - 2 Score '2 1 4  ' Altered sleeping '2 3 3  ' Tired, decreased energy '2 1 3  ' Change in appetite 1 0 2  Feeling bad or failure about yourself  '1 1 2  ' Trouble concentrating '1 1 1  ' Moving slowly or fidgety/restless 0 0 1  Suicidal thoughts 0 0 1  PHQ-9 Score '9 7 17  ' Difficult doing work/chores - - Very difficult   GAD 7 : Generalized Anxiety Score 10/28/2020 09/19/2020 09/10/2020 05/24/2020  Nervous, Anxious, on Edge '1 1 2 1  ' Control/stop worrying '1 2 2 ' 0  Worry too much - different things '1 2  2 ' 0  Trouble relaxing '2 1 2 ' 0  Restless 1 0 1 1  Easily annoyed or irritable 1 0 2 1  Afraid - awful might happen 1 0 2 0  Total GAD 7 Score '8 6 13 3  ' Anxiety Difficulty - - - -      Past Medical History:  Diagnosis Date  . Allergy    Phreesia 07/07/2020  . Anemia   . Anxiety    Phreesia 07/07/2020  . Asthma    Phreesia 07/07/2020  . Cardiac arrhythmia   . Depression with anxiety   . Diabetes mellitus without complication ([place])    Phreesia 07/07/2020  . Exercise-induced asthma   . Hearing deficit, bilateral   . IBS (irritable bowel syndrome)   . Migraines   . Morbid obesity ([place])   . Prediabetes   . S/P laparoscopic cholecystectomy   . Seasonal allergies    Allergies  Allergen Reactions  . Penicillins Anaphylaxis and Hives    Current Outpatient Medications on File Prior to Visit  Medication Sig Dispense Refill  . Accu-Chek Softclix Lancets lancets Use to check blood sugar once daily. 100 each 2  . albuterol (VENTOLIN HFA) 108 (90 Base) MCG/ACT inhaler SMARTSIG:2 Puff(s) By Mouth Every 6 Hours PRN    . Blood Glucose Monitoring Suppl (ACCU-CHEK GUIDE ME) w/Device KIT Use to check blood sugar once daily. 1 kit 0  . eletriptan (RELPAX) 20 MG tablet Take 20 mg by mouth 2 (  two) times daily as needed.    . famotidine (PEPCID) 20 MG tablet Take 20 mg by mouth daily.    . ferrous sulfate 325 (65 FE) MG tablet Take 1 tablet (325 mg total) by mouth 2 (two) times daily with a meal. 60 tablet 4  . glucose blood (ACCU-CHEK GUIDE) test strip Use to check blood sugar once daily. 100 each 2  . glycopyrrolate (ROBINUL) 2 MG tablet Take 1 tablet (2 mg total) by mouth 2 (two) times daily as needed (cramping/bloat). 60 tablet 6  . hydrOXYzine (ATARAX/VISTARIL) 10 MG tablet Take 10 mg by mouth at bedtime.     . metFORMIN (GLUCOPHAGE) 850 MG tablet Take 1 tablet (850 mg total) by mouth 2 (two) times daily with a meal. 180 tablet 1  . Norethindrone-Ethinyl Estradiol-Fe Biphas (LO LOESTRIN  FE) 1 MG-10 MCG / 10 MCG tablet Take 1 tablet by mouth daily. 28 tablet 9  . prazosin (MINIPRESS) 1 MG capsule Take 1 mg by mouth at bedtime.      No current facility-administered medications on file prior to visit.    Observations/Objective: NAD. Speaking clearly.  Work of breathing normal.  Alert and oriented. Mood appropriate.   Assessment and Plan: 1. Depression with anxiety PHQ-9 and GAD-7 scores stable since visit with Christa See, LCSW and greatly improved since prior visit with this provider. Patient reports great improvement in her mood. Continue current regimen of medications. Encouraged continued follow up with behavioral health.  - sertraline (ZOLOFT) 50 MG tablet; Take 3 tablets (150 mg total) by mouth daily.  Dispense: 270 tablet; Refill: 1 - busPIRone (BUSPAR) 5 MG tablet; Take 1 tablet (5 mg total) by mouth 2 (two) times daily.  Dispense: 180 tablet; Refill: 1  2. Insomnia, unspecified type - traZODone (DESYREL) 50 MG tablet; Take 0.5-1 tablets (25-50 mg total) by mouth at bedtime as needed for sleep.  Dispense: 90 tablet; Refill: 1  3. Family history of thyroid disease Patient asks to have TSH checked due to this and some vague symptoms.  - TSH; Future   Follow Up Instructions: Lab visit 11/23   I discussed the assessment and treatment plan with the patient. The patient was provided an opportunity to ask questions and all were answered. The patient agreed with the plan and demonstrated an understanding of the instructions.   The patient was advised to call back or seek an in-person evaluation if the symptoms worsen or if the condition fails to improve as anticipated.     I provided 14 minutes total of non-face-to-face time during this encounter including median intraservice time, reviewing previous notes, investigations, ordering medications, medical decision making, coordinating care and patient verbalized understanding at the end of the visit.    Phill Myron, D.O. Primary Care at [facility]  10/28/2020, 2:19 PM

## 2020-10-29 ENCOUNTER — Other Ambulatory Visit: Payer: Self-pay

## 2020-10-29 ENCOUNTER — Other Ambulatory Visit: Payer: Medicaid Other

## 2020-10-29 DIAGNOSIS — Z8349 Family history of other endocrine, nutritional and metabolic diseases: Secondary | ICD-10-CM

## 2020-10-30 LAB — TSH: TSH: 2.62 u[IU]/mL (ref 0.450–4.500)

## 2020-11-06 ENCOUNTER — Encounter: Payer: Self-pay | Admitting: Advanced Practice Midwife

## 2020-11-06 ENCOUNTER — Other Ambulatory Visit: Payer: Self-pay

## 2020-11-06 ENCOUNTER — Ambulatory Visit (INDEPENDENT_AMBULATORY_CARE_PROVIDER_SITE_OTHER): Payer: Medicaid Other | Admitting: Advanced Practice Midwife

## 2020-11-06 VITALS — BP 120/81 | HR 87 | Temp 97.9°F | Ht 65.0 in | Wt 297.0 lb

## 2020-11-06 DIAGNOSIS — Z0000 Encounter for general adult medical examination without abnormal findings: Secondary | ICD-10-CM

## 2020-11-06 DIAGNOSIS — N644 Mastodynia: Secondary | ICD-10-CM

## 2020-11-06 NOTE — Progress Notes (Signed)
  Subjective:     Patient ID: Sara Cervantes, female   DOB: 2001/11/24, 19 y.o.   MRN: [id number]  Sara Cervantes is a 19 y.o. G0P0000 who presents today with bilateral breast pain and lumps. She has noticed this since 11/03/2020. She reports that the pain is intermittent and rates her pain 6/10. When she has the pain it feels like a sharp stabbing pain. She has tried tylenol and heating pads and this has helped some with the pain. At the same time as the pain starting she has also noticed lumps in both breasts. She denies any nipple discharge. LMP 11/02/2020. Period started the day before the lumps and pain started. She also has this same issue a few weeks ago that went away on its own. She can't remember exactly, but she thinks it was approx 3 weeks ago. Patient did not have a period in October and reports that she has irregular periods. She denies being sexually active currently. Last intercourse about one month ago. She is on lo lo estrin for birth control.     Review of Systems  Constitutional: Positive for fatigue.  Gastrointestinal: Positive for nausea.  All other systems reviewed and are negative.      Objective:   Physical Exam Vitals and nursing note reviewed. Exam conducted with a chaperone present.  Constitutional:      General: She is not in acute distress. HENT:     Head: Normocephalic.     Mouth/Throat:     Mouth: Mucous membranes are moist.  Cardiovascular:     Rate and Rhythm: Normal rate.  Pulmonary:     Effort: Pulmonary effort is normal.  Chest:     Breasts:        Right: Normal.        Left: Normal.       Comments: Normal ductal tissue palpated in the areas where patient is feeling lumps.  Skin:    General: Skin is warm and dry.  Neurological:     Mental Status: She is alert and oriented to person, place, and time.  Psychiatric:        Mood and Affect: Mood normal.        Assessment:     1. Normal breast exam   2. Breast pain          Plan:     - Reassured patient that since the pain and lumps seem to coincide with her cycle that this is the most likely cause. No concerning lumps felt today.  - Patient advised to keep track of cycle and pain/lumps to see if they continue to occur together.    Thressa Sheller DNP, CNM  11/06/20  12:02 PM

## 2020-11-12 ENCOUNTER — Other Ambulatory Visit: Payer: Self-pay | Admitting: Internal Medicine

## 2020-11-12 DIAGNOSIS — R7303 Prediabetes: Secondary | ICD-10-CM

## 2020-11-18 ENCOUNTER — Other Ambulatory Visit: Payer: Self-pay

## 2020-11-18 ENCOUNTER — Ambulatory Visit (INDEPENDENT_AMBULATORY_CARE_PROVIDER_SITE_OTHER): Payer: Medicaid Other | Admitting: Obstetrics and Gynecology

## 2020-11-18 ENCOUNTER — Encounter: Payer: Self-pay | Admitting: Obstetrics and Gynecology

## 2020-11-18 VITALS — BP 113/76 | HR 76 | Ht 65.0 in | Wt 297.0 lb

## 2020-11-18 DIAGNOSIS — E282 Polycystic ovarian syndrome: Secondary | ICD-10-CM

## 2020-11-18 NOTE — Patient Instructions (Signed)

## 2020-11-18 NOTE — Progress Notes (Signed)
19 yo P0 with BMI 49 here for discussion on PCOS. She reports menarche at age 19. She reports irregular periods since often skipping periods for several months. She reports vaginal period during her menses varying from 3 days to 2 weeks and associated with dysmenorrhea. She is currently on Loestrin birth control pills. Patient denies any pelvic pain or abnormal discharge. She is currently without complaints. She denies female pattern hair distribution  Past Medical History:  Diagnosis Date  . Allergy    Phreesia 07/07/2020  . Anemia   . Anxiety    Phreesia 07/07/2020  . Asthma    Phreesia 07/07/2020  . Cardiac arrhythmia   . Depression with anxiety   . Diabetes mellitus without complication ([facility])    Phreesia 07/07/2020  . Exercise-induced asthma   . Hearing deficit, bilateral   . IBS (irritable bowel syndrome)   . Migraines   . Morbid obesity ([facility])   . Prediabetes   . S/P laparoscopic cholecystectomy   . Seasonal allergies    Past Surgical History:  Procedure Laterality Date  . CHOLECYSTECTOMY  02/2015   Family History  Problem Relation Age of Onset  . Hypertension Mother   . Diabetes Father   . Hypertension Father   . Kidney disease Father   . Seizures Brother   . Other Brother        smooth brain disease  . Seizures Maternal Grandmother   . COPD Maternal Grandmother   . Lung cancer Maternal Grandfather   . Kidney disease Maternal Grandfather   . Breast cancer Paternal Grandmother   . Colon cancer Neg Hx   . Colon polyps Neg Hx   . Stomach cancer Neg Hx   . Rectal cancer Neg Hx   . Esophageal cancer Neg Hx    Social History   Tobacco Use  . Smoking status: Never Smoker  . Smokeless tobacco: Never Used  Vaping Use  . Vaping Use: Never used  Substance Use Topics  . Alcohol use: Never  . Drug use: Never   ROS See pertinent in HPI. All other systems non contributory  Blood pressure 113/76, pulse 76, height 5\' 5"  (1.651 m), weight 297 lb (134.7 kg), last  menstrual period 11/02/2020. GENERAL: Well-developed, well-nourished female in no acute distress.  EXTREMITIES: No cyanosis, clubbing, or edema, 2+ distal pulses. NEURO: alert and oriented x 3  A/P 19 yo with likely PCOS - Information provided on PCOS - Discussed continued use of contraception - Discussed weight loss management. Will refer to nutritionist - RTC prn

## 2020-11-18 NOTE — Progress Notes (Signed)
19 y.o GYN presents to discuss PCOS, denies family hx.

## 2020-11-21 ENCOUNTER — Other Ambulatory Visit (HOSPITAL_COMMUNITY)
Admission: RE | Admit: 2020-11-21 | Discharge: 2020-11-21 | Disposition: A | Discharge status: Home / Self Care | Payer: Medicaid Other | Source: Ambulatory Visit | Attending: Obstetrics and Gynecology | Admitting: Obstetrics and Gynecology

## 2020-11-21 ENCOUNTER — Encounter: Payer: Self-pay | Admitting: Obstetrics and Gynecology

## 2020-11-21 ENCOUNTER — Other Ambulatory Visit: Payer: Self-pay

## 2020-11-21 ENCOUNTER — Ambulatory Visit (INDEPENDENT_AMBULATORY_CARE_PROVIDER_SITE_OTHER): Payer: Medicaid Other | Admitting: Obstetrics and Gynecology

## 2020-11-21 VITALS — BP 113/72 | HR 92 | Temp 98.1°F | Ht 65.0 in | Wt 300.4 lb

## 2020-11-21 DIAGNOSIS — N898 Other specified noninflammatory disorders of vagina: Secondary | ICD-10-CM

## 2020-11-21 NOTE — Progress Notes (Signed)
  GYNECOLOGY PROGRESS NOTE  History:  Ms. Sara Cervantes is a 19 y.o. G0P0000 presents to [facility] office today for problem gyn visit. She reports vaginal swelling, redness, itching, odor, and liquid, foamy vaginal discharge all for 1 week.  She denies h/a, dizziness, shortness of breath, n/v, or fever/chills. She reports having only 1 exclusive sexual partner. She uses condoms with every sexual encounter. Last SI was 1 week ago. She stopped taking birth control 1 month ago, because "I've been on [facility] since age 61 and want to experience my body without BC and to lose weight." She was seen by her PCP who referred her to "some doctor" who dx'd her with PCOS on 11/18/2020.  The following portions of the patient's history were reviewed and updated as appropriate: allergies, current medications, past family history, past medical history, past social history, past surgical history and problem list.   Review of Systems:  Pertinent items are noted in HPI.   Objective:  Physical Exam Blood pressure 113/72, pulse 92, temperature 98.1 F (36.7 C), temperature source Oral, height 5\' 5"  (1.651 m), weight (!) 300 lb 6.4 oz (136.3 kg), last menstrual period 11/02/2020. VS reviewed, nursing note reviewed,  Constitutional: well developed, well nourished, no distress HEENT: normocephalic CV: normal rate Pulm/chest wall: normal effort Breast Exam: deferred Abdomen: soft Neuro: alert and oriented x 3 Skin: warm, dry Psych: affect normal Pelvic exam: Cervix pink, visually closed, without lesion, scant white creamy discharge, vaginal walls and external genitalia normal Bimanual exam: Cervix 0/long/high, firm, anterior, neg CMT, uterus nontender, nonenlarged, adnexa without tenderness, enlargement, or mass  Assessment & Plan:  1. Vaginal irritation - Cervicovaginal ancillary only( [place])  2. Vaginal discharge - Cervicovaginal ancillary only( [place])  3. Vaginal odor -  Cervicovaginal ancillary only( [place])   11/04/2020, CNM 3:28 PM

## 2020-11-22 LAB — CERVICOVAGINAL ANCILLARY ONLY
Bacterial Vaginitis (gardnerella): POSITIVE — AB
Candida Glabrata: NEGATIVE
Candida Vaginitis: POSITIVE — AB
Chlamydia: POSITIVE — AB
Comment: NEGATIVE
Comment: NEGATIVE
Comment: NEGATIVE
Comment: NEGATIVE
Comment: NEGATIVE
Comment: NORMAL
Neisseria Gonorrhea: NEGATIVE
Trichomonas: NEGATIVE

## 2020-11-24 ENCOUNTER — Ambulatory Visit (HOSPITAL_COMMUNITY): Admit: 2020-11-24 | Discharge status: Against Medical Advice | Payer: Medicaid Other

## 2020-11-24 ENCOUNTER — Encounter (HOSPITAL_COMMUNITY): Payer: Self-pay | Admitting: Emergency Medicine

## 2020-11-24 ENCOUNTER — Emergency Department (HOSPITAL_COMMUNITY)
Admission: EM | Admit: 2020-11-24 | Discharge: 2020-11-24 | Disposition: A | Discharge status: Home / Self Care | Payer: Medicaid Other | Attending: Emergency Medicine | Admitting: Emergency Medicine

## 2020-11-24 ENCOUNTER — Other Ambulatory Visit: Payer: Self-pay

## 2020-11-24 DIAGNOSIS — A64 Unspecified sexually transmitted disease: Secondary | ICD-10-CM | POA: Diagnosis not present

## 2020-11-24 DIAGNOSIS — R102 Pelvic and perineal pain: Secondary | ICD-10-CM | POA: Insufficient documentation

## 2020-11-24 DIAGNOSIS — Z5321 Procedure and treatment not carried out due to patient leaving prior to being seen by health care provider: Secondary | ICD-10-CM | POA: Insufficient documentation

## 2020-11-24 MED ORDER — ONDANSETRON 4 MG PO TBDP
4.0000 mg | ORAL_TABLET | Freq: Once | ORAL | Status: AC | PRN
  Administered 2020-11-24: 4 mg via ORAL
  Filled 2020-11-24: qty 1

## 2020-11-24 NOTE — ED Triage Notes (Signed)
Patient states she was STD tested on Thursday and was positive for chlamydia and yeast. Patient states they did not prescribe her anything and she is now having pelvic pain that began yesterday and has gotten progressively worse over the last 5 hours. Denies dysuria, endorses nausea.

## 2020-11-25 ENCOUNTER — Ambulatory Visit: Payer: Medicaid Other | Admitting: Internal Medicine

## 2020-11-26 ENCOUNTER — Other Ambulatory Visit: Payer: Medicaid Other

## 2020-11-27 ENCOUNTER — Telehealth: Payer: Self-pay | Admitting: *Deleted

## 2020-11-27 DIAGNOSIS — B373 Candidiasis of vulva and vagina: Secondary | ICD-10-CM

## 2020-11-27 DIAGNOSIS — B9689 Other specified bacterial agents as the cause of diseases classified elsewhere: Secondary | ICD-10-CM

## 2020-11-27 DIAGNOSIS — B3731 Acute candidiasis of vulva and vagina: Secondary | ICD-10-CM

## 2020-11-27 MED ORDER — FLUCONAZOLE 150 MG PO TABS: 150.0000 mg | ORAL_TABLET | Freq: Once | ORAL | 0 refills | Status: AC

## 2020-11-27 MED ORDER — METRONIDAZOLE 500 MG PO TABS: 500.0000 mg | ORAL_TABLET | Freq: Two times a day (BID) | ORAL | 0 refills | Status: DC

## 2020-11-27 NOTE — Telephone Encounter (Signed)
   S: Patient called today for STD treatment of Chlamydia, BV and yeast.    O: Need for treatment of Chlamydia.  A: Doxycycline 100 mg BID x 7 days were prescribe by another provider office.  P: Patient to follow up in 1 month for re-screening.    STD report form fax completed and [facility] at [phone] (STD department).     Patient advised to abstain from sex for 7-10 days after treatment or when partner has been tested/treated.    Clovis Pu, RN

## 2020-11-27 NOTE — Telephone Encounter (Signed)
-----   Message from Raelyn Mora, [place] sent at 11/26/2020  9:59 AM EST ----- Please notify and treat for Chlamydia, BV then yeast

## 2020-11-28 ENCOUNTER — Encounter: Payer: Self-pay | Admitting: Obstetrics and Gynecology

## 2020-11-28 ENCOUNTER — Other Ambulatory Visit: Payer: Self-pay

## 2020-11-28 DIAGNOSIS — D509 Iron deficiency anemia, unspecified: Secondary | ICD-10-CM

## 2020-12-03 ENCOUNTER — Telehealth: Payer: Self-pay

## 2020-12-03 NOTE — Telephone Encounter (Signed)
-----   Message from Evalee Jefferson, LPN sent at 3/70/4888  2:21 PM EDT ----- Repeat of iron studies due

## 2020-12-03 NOTE — Telephone Encounter (Signed)
Called patient and asked her to come into our lab for repeat Iron studies.(4 month F/U). She is out of town this week, but agrees to come in on Monday 12/09/20

## 2021-01-01 ENCOUNTER — Other Ambulatory Visit: Payer: Self-pay

## 2021-01-02 ENCOUNTER — Encounter: Payer: Self-pay | Admitting: Internal Medicine

## 2021-01-02 ENCOUNTER — Ambulatory Visit (INDEPENDENT_AMBULATORY_CARE_PROVIDER_SITE_OTHER): Payer: Medicaid Other | Admitting: Internal Medicine

## 2021-01-02 ENCOUNTER — Other Ambulatory Visit: Payer: Self-pay

## 2021-01-02 VITALS — BP 129/84 | HR 98 | Temp 98.5°F | Resp 20 | Ht 65.0 in | Wt 300.0 lb

## 2021-01-02 DIAGNOSIS — D500 Iron deficiency anemia secondary to blood loss (chronic): Secondary | ICD-10-CM

## 2021-01-02 DIAGNOSIS — E282 Polycystic ovarian syndrome: Secondary | ICD-10-CM

## 2021-01-02 DIAGNOSIS — R7303 Prediabetes: Secondary | ICD-10-CM | POA: Diagnosis not present

## 2021-01-02 DIAGNOSIS — R0683 Snoring: Secondary | ICD-10-CM

## 2021-01-02 DIAGNOSIS — J45990 Exercise induced bronchospasm: Secondary | ICD-10-CM

## 2021-01-02 DIAGNOSIS — Z72820 Sleep deprivation: Secondary | ICD-10-CM

## 2021-01-02 MED ORDER — NORETHIN ACE-ETH ESTRAD-FE 1-20 MG-MCG(24) PO TABS: 1.0000 | ORAL_TABLET | Freq: Every day | ORAL | 11 refills | Status: DC

## 2021-01-02 MED ORDER — FERROUS SULFATE 324 (65 FE) MG PO TBEC: 1.0000 | DELAYED_RELEASE_TABLET | ORAL | 0 refills | Status: DC

## 2021-01-02 NOTE — Patient Instructions (Signed)
How to take oral contraceptive pills Options to start taking your first cycle of OCPs: 1. Start the pill on day 1 of your menstrual period. If you start at this time, you will not need any backup form of birth control (contraception), such as condoms. 2. Start the pill on the first Sunday after your menstrual period or on the day you get your prescription. In these cases, you will need to use backup contraception for the first week. 3. Start the pill at any time of your cycle. ? If you take the pill within 5 days of the start of your period, you will not need a backup form of contraception. ? If you start at any other time of your menstrual cycle, you will need to use another form of contraception for 7 days. If your OCP is the type called a minipill, it will protect you from pregnancy after taking it for 2 days (48 hours), and you can stop using backup contraception after that time. After you have started taking OCPs:  If you forget to take 1 pill, take it as soon as you remember. Take the next pill at the regular time.  If you miss 2 or more pills, call your health care provider. Different pills have different instructions for missed doses. Use backup birth control until your next menstrual period starts.  If you use a 28-day pack that contains inactive pills and you miss 1 of the last 7 pills (pills with no hormones), throw away the rest of the non-hormone pills and start a new pill pack. No matter which day you start the OCP, you will always start a new pack on that same day of the week. Have an extra pack of OCPs and a backup contraceptive method available in case you miss some pills or lose your OCP pack. 

## 2021-01-02 NOTE — Progress Notes (Signed)
Discuss getting checked for diabetes, reports low blood sugars, dizziness and fainting x 1 month   Reports recently diagnosed w/ polycystic ovarian syndrome in Nov 2021  Requesting prescription strength iron pills, cannot afford OTC so has not been taking

## 2021-01-02 NOTE — Progress Notes (Signed)
Subjective:    Sara Cervantes - 20 y.o. female MRN [id number]  Date of birth: [birth year]  HPI  Sara Cervantes is here for multiple concerns.  Was previously on Lo-lo estrin for her menstrual cycle. She was just diagnosed with PCOS. She would like to resume OCP. Reports that with prior OCP she still had heavy, long lasting menses.    Started snoring very loudly at nighttime. Sometimes can't sleep because she is gasping for air. Had a sleep study done that was normal but has been over 2 years. Does not feel well rested. Endorses morning headaches upon occasion. These symptoms occur about 3x per week.   Breathing problems every time she exercises. Uses albuterol 30 minutes prior to exercise. Always has to use rescue inhaler during exercise as well. Never been on a controller inhaler. Outside of exercise, uses albuterol once per week. 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    Health Maintenance:  There are no preventive care reminders to display for this patient.  -  reports that she has never smoked. She has never used smokeless tobacco. - Review of Systems: Per HPI. - Past Medical History: Patient Active Problem List   Diagnosis Date Noted  . Prediabetes 05/07/2020  . Bilateral hearing loss 02/06/2019  . Depression with anxiety 11/04/2018  . Environmental and seasonal allergies 11/04/2018  . Exercise-induced asthma 11/04/2018  . Migraine without aura and without status migrainosus, not intractable 11/04/2018  . Obesity, Class III, BMI 40-49.9 (morbid obesity) (HCC) 11/04/2018  . Irritable bowel syndrome 03/03/2018  . S/P laparoscopic cholecystectomy 02/06/2017   - Medications: reviewed and updated   Objective:   Physical Exam BP 129/84 (BP Location: Right Arm, Patient Position: Sitting, Cuff Size: Large)   Pulse 98   Temp 98.5 F (36.9 C) (Oral)   Resp 20   Ht 5\' 5"  (1.651 m)   Wt 300 lb (136.1 kg)   SpO2 96%   BMI 49.92 kg/m   Physical Exam Constitutional:      General: She is not in acute distress.    Appearance: She is not diaphoretic.  HENT:     Head: Normocephalic and atraumatic.  Eyes:     Extraocular Movements: EOM normal.     Conjunctiva/sclera: Conjunctivae normal.  Cardiovascular:     Rate and Rhythm: Normal rate and regular rhythm.     Heart sounds: Normal heart sounds. No murmur heard.   Pulmonary:     Effort: Pulmonary effort is normal. No respiratory distress.     Breath sounds: Normal breath sounds.  Musculoskeletal:        General: Normal range of motion.  Skin:    General: Skin is warm and dry.  Neurological:     Mental Status: She is alert and oriented to person, place, and time.  Psychiatric:        Mood and Affect: Affect normal.        Judgment: Judgment normal.            Assessment & Plan:   1. Prediabetes Last A1c 5.9% in June 2021. Will monitor. Continue Metformin.   - Hemoglobin A1c  2. Iron deficiency anemia due to chronic blood loss Mild, HgB 11.9 in Aug. Monitor. Continue Fe.  - CBC - ferrous sulfate 324 (65 Fe) MG TBEC; Take 1 tablet (325 mg total) by mouth every other day.  Dispense: 90 tablet; Refill: 0  3. PCOS (polycystic ovarian syndrome) Start higher dose estrogen  for menstrual regulation. No migraines with aura or VTE. No smoking history.  - Norethindrone Acetate-Ethinyl Estrad-FE (LOESTRIN 24 FE) 1-20 MG-MCG(24) tablet; Take 1 tablet by mouth daily.  Dispense: 28 tablet; Refill: 11  4. Exercise-induced asthma Significant. Given multiple episodes of asthma attacks per week with exercise and need to use albuterol frequently with inciting events, curious if she would benefit from repeat PFTs and/or controller inhaler. Have asked from input from Dr. Delford Field. Am also concerned that this is worsened by her body habitus and weight loss would likely improve respiratory status.   5. Snoring 6. Poor sleep 7. Class 3 severe obesity in adult, unspecified BMI,  unspecified obesity type, unspecified whether serious comorbidity present [facility]) Patient has multiple symptoms and risk factors for OSA. Will obtain sleep study.  - PSG Sleep Study; Future   Marcy Siren, D.O. 01/02/2021, 3:17 PM Primary Care at [facility]

## 2021-01-03 LAB — HEMOGLOBIN A1C
Est. average glucose Bld gHb Est-mCnc: 120 mg/dL
Hgb A1c MFr Bld: 5.8 % — ABNORMAL HIGH (ref 4.8–5.6)

## 2021-01-03 LAB — CBC
Hematocrit: 37.7 % (ref 34.0–46.6)
Hemoglobin: 12.2 g/dL (ref 11.1–15.9)
MCH: 25.4 pg — ABNORMAL LOW (ref 26.6–33.0)
MCHC: 32.4 g/dL (ref 31.5–35.7)
MCV: 79 fL (ref 79–97)
Platelets: 375 10*3/uL (ref 150–450)
RBC: 4.8 x10E6/uL (ref 3.77–5.28)
RDW: 14 % (ref 11.7–15.4)
WBC: 8.7 10*3/uL (ref 3.4–10.8)

## 2021-01-06 ENCOUNTER — Other Ambulatory Visit: Payer: Self-pay

## 2021-01-06 ENCOUNTER — Ambulatory Visit (INDEPENDENT_AMBULATORY_CARE_PROVIDER_SITE_OTHER): Payer: Medicaid Other | Admitting: *Deleted

## 2021-01-06 ENCOUNTER — Other Ambulatory Visit (HOSPITAL_COMMUNITY)
Admission: RE | Admit: 2021-01-06 | Discharge: 2021-01-06 | Disposition: A | Discharge status: Home / Self Care | Payer: Medicaid Other | Source: Ambulatory Visit | Attending: Obstetrics and Gynecology | Admitting: Obstetrics and Gynecology

## 2021-01-06 VITALS — BP 117/79 | HR 89 | Temp 98.2°F | Ht 65.0 in | Wt 299.2 lb

## 2021-01-06 DIAGNOSIS — Z113 Encounter for screening for infections with a predominantly sexual mode of transmission: Secondary | ICD-10-CM | POA: Diagnosis not present

## 2021-01-06 NOTE — Progress Notes (Signed)
   SUBJECTIVE:  20 y.o. female in clinic for test of cure for positive chlamydia from 11/2020. Denies abnormal vaginal bleeding or significant pelvic pain or fever. No UTI symptoms. Denies history of known exposure to STD.  No LMP recorded. (Menstrual status: Irregular Periods).  OBJECTIVE:  She appears well, afebrile. Urine dipstick: not done.  ASSESSMENT:  Denies symptoms. Test of cure.   PLAN:  GC, chlamydia, trichomonas urine cytology. Treatment: To be determined once lab results are received  Clovis Pu, RN

## 2021-01-07 LAB — URINE CYTOLOGY ANCILLARY ONLY
Chlamydia: NEGATIVE
Comment: NEGATIVE
Comment: NEGATIVE
Comment: NORMAL
Neisseria Gonorrhea: NEGATIVE
Trichomonas: NEGATIVE

## 2021-01-15 ENCOUNTER — Other Ambulatory Visit: Payer: Self-pay

## 2021-01-15 ENCOUNTER — Ambulatory Visit: Payer: Medicaid Other | Attending: Critical Care Medicine | Admitting: Critical Care Medicine

## 2021-01-15 ENCOUNTER — Encounter: Payer: Self-pay | Admitting: Critical Care Medicine

## 2021-01-15 VITALS — BP 116/66 | HR 87 | Temp 98.4°F | Resp 16 | Wt 297.0 lb

## 2021-01-15 DIAGNOSIS — K219 Gastro-esophageal reflux disease without esophagitis: Secondary | ICD-10-CM

## 2021-01-15 DIAGNOSIS — J309 Allergic rhinitis, unspecified: Secondary | ICD-10-CM

## 2021-01-15 DIAGNOSIS — G4733 Obstructive sleep apnea (adult) (pediatric): Secondary | ICD-10-CM

## 2021-01-15 DIAGNOSIS — J3089 Other allergic rhinitis: Secondary | ICD-10-CM

## 2021-01-15 DIAGNOSIS — J45990 Exercise induced bronchospasm: Secondary | ICD-10-CM | POA: Diagnosis not present

## 2021-01-15 MED ORDER — FLUTICASONE PROPIONATE 50 MCG/ACT NA SUSP: 2.0000 | Freq: Every day | NASAL | 6 refills | Status: DC

## 2021-01-15 MED ORDER — MONTELUKAST SODIUM 10 MG PO TABS: 10.0000 mg | ORAL_TABLET | Freq: Every day | ORAL | 3 refills | Status: DC

## 2021-01-15 MED ORDER — ALBUTEROL SULFATE HFA 108 (90 BASE) MCG/ACT IN AERS: 2.0000 | INHALATION_SPRAY | Freq: Four times a day (QID) | RESPIRATORY_TRACT | 1 refills | Status: AC | PRN

## 2021-01-15 MED ORDER — FLOVENT HFA 44 MCG/ACT IN AERO: 2.0000 | INHALATION_SPRAY | Freq: Two times a day (BID) | RESPIRATORY_TRACT | 12 refills | Status: AC

## 2021-01-15 MED ORDER — PANTOPRAZOLE SODIUM 40 MG PO TBEC: 40.0000 mg | DELAYED_RELEASE_TABLET | Freq: Every day | ORAL | 3 refills | Status: AC

## 2021-01-15 NOTE — Assessment & Plan Note (Signed)
Exercise-induced asthma worsening is a patient gets older and gained more weight  Reflux likely contributing Probably allergic factors with severe nasal turbinate edema  Probable sleep apnea as well  Begin Flonase 2 sprays each nostril daily, Flovent 44 mcg strength 2 puffs twice daily, albuterol as needed, begin Singulair daily , spacer device given for HFA inhalers and patient instructed as to proper HFA technique  Peak flow meter given to the patient demonstrated as to his use patient to record numbers current value right now is about 300 but she does not display a full effort  May yet benefit from an allergy evaluation

## 2021-01-15 NOTE — Assessment & Plan Note (Signed)
Begin Flonase yet will need allergy eval

## 2021-01-15 NOTE — Progress Notes (Addendum)
Subjective:    Patient ID: Sara Cervantes, female    DOB: [birth year], 20 y.o.   MRN: [id number]  19 y.o.F hx Asthma ref from Dr Juleen China  01/15/2021 This is a patient referred by Dr. Juleen China for exercise-induced asthma.  This patient has a history since adolescence of exercise-induced asthma when she does sport activities.  More recently her weight has increased significantly she now has increased snoring daytime hypersomnolence increased fatigue dry mouth headaches lack of concentration and focus.  She is not been evaluated by sleep physicians recently.  She is yet to have a sleep study.  She does have a sleep study pending at this time is not yet been performed.  The patient does tend to eat a lot of red meat is trying to improve her diet and is back on an exercise program.  When she does try to exercise she has significant asthma symptoms.  See asthma assessment below.   Asthma She complains of chest tightness, cough, difficulty breathing, frequent throat clearing, hoarse voice, shortness of breath and wheezing. There is no hemoptysis or sputum production. The current episode started more than 1 year ago. The problem occurs every several days (exercise induced only ). The problem has been gradually worsening. The cough is non-productive and dry. Associated symptoms include appetite change, chest pain, dyspnea on exertion, ear congestion, ear pain, headaches, malaise/fatigue, myalgias, nasal congestion, PND, postnasal drip, rhinorrhea, sneezing and a sore throat. Pertinent negatives include no fever, heartburn, orthopnea, trouble swallowing or weight loss. Her symptoms are aggravated by any activity, exercise, change in weather, pollen, emotional stress, exposure to smoke, exposure to fumes, URI, animal exposure, climbing stairs, lying down and strenuous activity. Her symptoms are alleviated by beta-agonist. She reports no improvement on treatment. Her past medical history is significant for  asthma. There is no history of pneumonia.   Past Medical History:  Diagnosis Date  . Allergy    Phreesia 07/07/2020  . Anemia   . Anxiety    Phreesia 07/07/2020  . Asthma    Phreesia 07/07/2020  . Cardiac arrhythmia   . Depression with anxiety   . Diabetes mellitus without complication ([place])   . Exercise-induced asthma   . Hearing deficit, bilateral   . IBS (irritable bowel syndrome)   . Migraines   . Morbid obesity ([place])   . Prediabetes   . S/P laparoscopic cholecystectomy   . Seasonal allergies      Family History  Problem Relation Age of Onset  . Hypertension Mother   . Asthma Mother   . Diabetes Father   . Hypertension Father   . Kidney disease Father   . Seizures Brother   . Other Brother        smooth brain disease  . Seizures Maternal Grandmother   . COPD Maternal Grandmother   . Asthma Maternal Grandmother   . Lung cancer Maternal Grandfather   . Kidney disease Maternal Grandfather   . Breast cancer Paternal Grandmother   . Colon cancer Neg Hx   . Colon polyps Neg Hx   . Stomach cancer Neg Hx   . Rectal cancer Neg Hx   . Esophageal cancer Neg Hx      Social History   Socioeconomic History  . Marital status: Single    Spouse name: Not on file  . Number of children: 0  . Years of education: Not on file  . Highest education level: Not on file  Occupational History  . Occupation: Ship broker  Tobacco Use  . Smoking status: Never Smoker  . Smokeless tobacco: Never Used  Vaping Use  . Vaping Use: Never used  Substance and Sexual Activity  . Alcohol use: Never  . Drug use: Never  . Sexual activity: Yes    Birth control/protection: None  Other Topics Concern  . Not on file  Social History Narrative  . Not on file   Social Determinants of Health   Financial Resource Strain: Not on file  Food Insecurity: Not on file  Transportation Needs: Not on file  Physical Activity: Not on file  Stress: Not on file  Social Connections: Not on file   Intimate Partner Violence: Not on file     Allergies  Allergen Reactions  . Penicillins Anaphylaxis and Hives     Outpatient Medications Prior to Visit  Medication Sig Dispense Refill  . busPIRone (BUSPAR) 5 MG tablet Take 1 tablet (5 mg total) by mouth 2 (two) times daily. 180 tablet 1  . eletriptan (RELPAX) 20 MG tablet Take 20 mg by mouth 2 (two) times daily as needed.    . ferrous sulfate 324 (65 Fe) MG TBEC Take 1 tablet (325 mg total) by mouth every other day. 90 tablet 0  . glucose blood (ACCU-CHEK GUIDE) test strip Use to check blood sugar once daily. 100 each 2  . glycopyrrolate (ROBINUL) 2 MG tablet Take 1 tablet (2 mg total) by mouth 2 (two) times daily as needed (cramping/bloat). 60 tablet 6  . hydrOXYzine (ATARAX/VISTARIL) 10 MG tablet Take 10 mg by mouth at bedtime.     . prazosin (MINIPRESS) 1 MG capsule Take 1 mg by mouth at bedtime.     . sertraline (ZOLOFT) 50 MG tablet Take 3 tablets (150 mg total) by mouth daily. 270 tablet 1  . traZODone (DESYREL) 50 MG tablet Take 0.5-1 tablets (25-50 mg total) by mouth at bedtime as needed for sleep. 90 tablet 1  . albuterol (VENTOLIN HFA) 108 (90 Base) MCG/ACT inhaler SMARTSIG:2 Puff(s) By Mouth Every 6 Hours PRN    . famotidine (PEPCID) 20 MG tablet Take 20 mg by mouth daily.    . metFORMIN (GLUCOPHAGE) 850 MG tablet TAKE 1 TABLET (850 MG TOTAL) BY MOUTH 2 (TWO) TIMES DAILY WITH A MEAL. 180 tablet 0  . Accu-Chek Softclix Lancets lancets Use to check blood sugar once daily. 100 each 2  . Blood Glucose Monitoring Suppl (ACCU-CHEK GUIDE ME) w/Device KIT Use to check blood sugar once daily. 1 kit 0  . metroNIDAZOLE (FLAGYL) 500 MG tablet Take 1 tablet (500 mg total) by mouth 2 (two) times daily. Take with food (Patient not taking: Reported on 01/02/2021) 14 tablet 0  . Norethindrone Acetate-Ethinyl Estrad-FE (LOESTRIN 24 FE) 1-20 MG-MCG(24) tablet Take 1 tablet by mouth daily. 28 tablet 11   No facility-administered medications prior  to visit.      Review of Systems  Constitutional: Positive for appetite change and malaise/fatigue. Negative for fever and weight loss.  HENT: Positive for ear pain, hoarse voice, postnasal drip, rhinorrhea, sneezing and sore throat. Negative for trouble swallowing.   Respiratory: Positive for cough, shortness of breath and wheezing. Negative for hemoptysis and sputum production.   Cardiovascular: Positive for chest pain, dyspnea on exertion and PND.  Gastrointestinal: Negative for heartburn.  Musculoskeletal: Positive for myalgias.  Neurological: Positive for headaches.       Objective:   Physical Exam Vitals:   01/15/21 0933  BP: 116/66  Pulse: 87  Resp: 16  Temp:  98.4 F (36.9 C)  TempSrc: Oral  SpO2: 97%  Weight: 297 lb (134.7 kg)    Gen: Pleasant, obese, in no distress,  normal affect  ENT: ,  mouth clear,  oropharynx clear, 3+ postnasal drip, significant nasal turbinate edema without purulence, small mouth large tongue difficult airway in appearance  Neck: No JVD, no TMG, no carotid bruits  Lungs: No use of accessory muscles, no dullness to percussion, clear without rales or rhonchi  Cardiovascular: RRR, heart sounds normal, no murmur or gallops, no peripheral edema  Abdomen: soft and NT, no HSM,  BS normal  Musculoskeletal: No deformities, no cyanosis or clubbing  Neuro: alert, non focal  Skin: Warm, no lesions or rashes      Assessment & Plan:  I personally reviewed all images and lab data in the [facility] system as well as any outside material available during this office visit and agree with the  radiology impressions.   GERD (gastroesophageal reflux disease) Reflux disease likely contributing  Reflux diet given and will begin pantoprazole daily  Exercise-induced asthma Exercise-induced asthma worsening is a patient gets older and gained more weight  Reflux likely contributing Probably allergic factors with severe nasal turbinate edema  Probable  sleep apnea as well  Begin Flonase 2 sprays each nostril daily, Flovent 44 mcg strength 2 puffs twice daily, albuterol as needed, begin Singulair daily , spacer device given for HFA inhalers and patient instructed as to proper HFA technique  Peak flow meter given to the patient demonstrated as to his use patient to record numbers current value right now is about 300 but she does not display a full effort  May yet benefit from an allergy evaluation  Allergic rhinitis Start Flonase  Obesity, Class III, BMI 40-49.9 (morbid obesity) ([place]) Went over a diet plan is patient to encourage weight loss  Environmental and seasonal allergies Begin Flonase yet will need allergy eval  OSA (obstructive sleep apnea) Keep planned sleep study appointment and we will go ahead and refer to sleep medicine   Diagnoses and all orders for this visit:  Exercise-induced asthma  OSA (obstructive sleep apnea) -     Ambulatory referral to Pulmonology  Gastroesophageal reflux disease without esophagitis  Allergic rhinitis, unspecified seasonality, unspecified trigger  Obesity, Class III, BMI 40-49.9 (morbid obesity) ([place])  Environmental and seasonal allergies  Other orders -     albuterol (VENTOLIN HFA) 108 (90 Base) MCG/ACT inhaler; Inhale 2 puffs into the lungs every 6 (six) hours as needed for wheezing or shortness of breath. -     fluticasone (FLOVENT HFA) 44 MCG/ACT inhaler; Inhale 2 puffs into the lungs in the morning and at bedtime. -     montelukast (SINGULAIR) 10 MG tablet; Take 1 tablet (10 mg total) by mouth at bedtime. -     Discontinue: fluticasone (FLONASE) 50 MCG/ACT nasal spray; Place 2 sprays into both nostrils daily. -     pantoprazole (PROTONIX) 40 MG tablet; Take 1 tablet (40 mg total) by mouth daily.

## 2021-01-15 NOTE — Assessment & Plan Note (Signed)
Keep planned sleep study appointment and we will go ahead and refer to sleep medicine

## 2021-01-15 NOTE — Assessment & Plan Note (Signed)
Start Flonase.

## 2021-01-15 NOTE — Assessment & Plan Note (Signed)
Reflux disease likely contributing  Reflux diet given and will begin pantoprazole daily

## 2021-01-15 NOTE — Patient Instructions (Signed)
Please monitor your peak flow rates per our instructions today and record those on your chart bring that back with you next visit with me, we will discuss it at the next visit which will be a video visit  Begin Flovent 2 sprays using AeroChamber twice a day Begin Singulair 1 tablet daily at bedtime Begin pantoprazole 1 tablet half hour before breakfast and eat and discontinue Pepcid Begin Flonase 2 sprays each nostril daily  Keep your sleep study appointment  Referral to sleep medicine will be made  Return to see Dr. Delford Field with a video visit in 1 month  We may be setting you up with an allergy referral at some point  Continue healthy diet follow a reflux diet as outlined below  Continue with your exercise plan as you are undertaking   Food Choices for Gastroesophageal Reflux Disease, Adult When you have gastroesophageal reflux disease (GERD), the foods you eat and your eating habits are very important. Choosing the right foods can help ease your discomfort. Think about working with a food expert (dietitian) to help you make good choices. What are tips for following this plan? Reading food labels  Look for foods that are low in saturated fat. Foods that may help with your symptoms include: ? Foods that have less than 5% of daily value (DV) of fat. ? Foods that have 0 grams of trans fat. Cooking  Do not fry your food.  Cook your food by baking, steaming, grilling, or broiling. These are all methods that do not need a lot of fat for cooking.  To add flavor, try to use herbs that are low in spice and acidity. Meal planning  Choose healthy foods that are low in fat, such as: ? Fruits and vegetables. ? Whole grains. ? Low-fat dairy products. ? Lean meats, fish, and poultry.  Eat small meals often instead of eating 3 large meals each day. Eat your meals slowly in a place where you are relaxed. Avoid bending over or lying down until 2-3 hours after eating.  Limit high-fat foods  such as fatty meats or fried foods.  Limit your intake of fatty foods, such as oils, butter, and shortening.  Avoid the following as told by your doctor: ? Foods that cause symptoms. These may be different for different people. Keep a food diary to keep track of foods that cause symptoms. ? Alcohol. ? Drinking a lot of liquid with meals. ? Eating meals during the 2-3 hours before bed.   Lifestyle  Stay at a healthy weight. Ask your doctor what weight is healthy for you. If you need to lose weight, work with your doctor to do so safely.  Exercise for at least 30 minutes on 5 or more days each week, or as told by your doctor.  Wear loose-fitting clothes.  Do not smoke or use any products that contain nicotine or tobacco. If you need help quitting, ask your doctor.  Sleep with the head of your bed higher than your feet. Use a wedge under the mattress or blocks under the bed frame to raise the head of the bed.  Chew sugar-free gum after meals. What foods should eat? Eat a healthy, well-balanced diet of fruits, vegetables, whole grains, low-fat dairy products, lean meats, fish, and poultry. Each person is different. Foods that may cause symptoms in one person may not cause any symptoms in another person. Work with your doctor to find foods that are safe for you. The items listed above may not  be a complete list of what you can eat and drink. Contact a food expert for more options.   What foods should I avoid? Limiting some of these foods may help in managing the symptoms of GERD. Everyone is different. Talk with a food expert or your doctor to help you find the exact foods to avoid, if any. Fruits Any fruits prepared with added fat. Any fruits that cause symptoms. For some people, this may include citrus fruits, such as oranges, grapefruit, pineapple, and lemons. Vegetables Deep-fried vegetables. [place] fries. Any vegetables prepared with added fat. Any vegetables that cause symptoms. For  some people, this may include tomatoes and tomato products, chili peppers, onions and garlic, and horseradish. Grains Pastries or quick breads with added fat. Meats and other proteins High-fat meats, such as fatty beef or pork, hot dogs, ribs, ham, sausage, salami, and bacon. Fried meat or protein, including fried fish and fried chicken. Nuts and nut butters, in large amounts. Dairy Whole milk and chocolate milk. Sour cream. Cream. Ice cream. Cream cheese. Milkshakes. Fats and oils Butter. Margarine. Shortening. Ghee. Beverages Coffee and tea, with or without caffeine. Carbonated beverages. Sodas. Energy drinks. Fruit juice made with acidic fruits, such as orange or grapefruit. Tomato juice. Alcoholic drinks. Sweets and desserts Chocolate and cocoa. Donuts. Seasonings and condiments Pepper. Peppermint and spearmint. Added salt. Any condiments, herbs, or seasonings that cause symptoms. For some people, this may include curry, hot sauce, or vinegar-based salad dressings. The items listed above may not be a complete list of what you should not eat and drink. Contact a food expert for more options. Questions to ask your doctor Diet and lifestyle changes are often the first steps that are taken to manage symptoms of GERD. If diet and lifestyle changes do not help, talk with your doctor about taking medicines. Where to find more information  International Foundation for Gastrointestinal Disorders: aboutgerd.org Summary  When you have GERD, food and lifestyle choices are very important in easing your symptoms.  Eat small meals often instead of 3 large meals a day. Eat your meals slowly and in a place where you are relaxed.  Avoid bending over or lying down until 2-3 hours after eating.  Limit high-fat foods such as fatty meats or fried foods. This information is not intended to replace advice given to you by your health care provider. Make sure you discuss any questions you have with your  health care provider. Document Revised: 06/03/2020 Document Reviewed: 06/03/2020 Elsevier Patient Education  2021 [employer].

## 2021-01-15 NOTE — Assessment & Plan Note (Signed)
Went over a diet plan is patient to encourage weight loss

## 2021-01-20 ENCOUNTER — Ambulatory Visit: Payer: Medicaid Other | Admitting: Internal Medicine

## 2021-01-20 ENCOUNTER — Ambulatory Visit (INDEPENDENT_AMBULATORY_CARE_PROVIDER_SITE_OTHER): Payer: Medicaid Other | Admitting: Internal Medicine

## 2021-01-20 ENCOUNTER — Encounter: Payer: Self-pay | Admitting: Internal Medicine

## 2021-01-20 ENCOUNTER — Other Ambulatory Visit: Payer: Self-pay

## 2021-01-20 DIAGNOSIS — G4733 Obstructive sleep apnea (adult) (pediatric): Secondary | ICD-10-CM

## 2021-01-20 DIAGNOSIS — R0683 Snoring: Secondary | ICD-10-CM

## 2021-01-20 NOTE — Assessment & Plan Note (Deleted)
Probable OSA. Appropriate discussion, including responsible driving safety, treatment Plan- Sleep study. Probable CPAP 

## 2021-01-20 NOTE — Assessment & Plan Note (Signed)
Probable OSA. Appropriate discussion, including responsible driving safety, treatment Plan- Sleep study. Probable CPAP

## 2021-01-20 NOTE — Assessment & Plan Note (Signed)
Difficult problem for her, but getting this under control could change the arc of her life. Consider Healthy Weight and Wellness.

## 2021-01-20 NOTE — Patient Instructions (Signed)
Order- schedule home sleep test  Dx Snoring  Please call us about 2 weeks after your sleep test for results and recommendations   

## 2021-01-20 NOTE — Progress Notes (Signed)
01/20/21- 27 yoF never smoke, studentr referred for sleep evaluation courtesy of Dr Lyda Jester Medical problem list includes Migraine, Allergic Rhinitis, Asthma, GERD, IBS, DM2, Polycystic Ovary Syndrome, Hearing Loss, Morbid Obesity,  -----Patient states that she snores, stops breathing and wakes up gasping for air, restless sleeper wakes up 3-5 times a night, wakes up with dry mouth and sore throat.  Body weight today- 300 lbs Epworth score-14 Covid vax- 3 Phizer Flu vax- had She is told of witnessed apneas, loud snoring. Admits daytime tiredness.  No sleep meds exept melatonin., occasional coffee or soft drink.  No ENT surgery. Some asthma, heart burn, palpitations.   Prior to Admission medications   Medication Sig Start Date End Date Taking? Authorizing Provider  Accu-Chek Softclix Lancets lancets Use to check blood sugar once daily. 09/27/20  Yes Charlott Rakes, MD  albuterol (VENTOLIN HFA) 108 (90 Base) MCG/ACT inhaler Inhale 2 puffs into the lungs every 6 (six) hours as needed for wheezing or shortness of breath. 01/15/21  Yes Nusaiba Stain, MD  Blood Glucose Monitoring Suppl (ACCU-CHEK GUIDE ME) w/Device KIT Use to check blood sugar once daily. 09/27/20  Yes Newlin, Charlane Ferretti, MD  busPIRone (BUSPAR) 5 MG tablet Take 1 tablet (5 mg total) by mouth 2 (two) times daily. 10/28/20  Yes Nicolette Bang, DO  eletriptan (RELPAX) 20 MG tablet Take 20 mg by mouth 2 (two) times daily as needed. 12/02/19  Yes [provider]  ferrous sulfate 324 (65 Fe) MG TBEC Take 1 tablet (325 mg total) by mouth every other day. 01/02/21  Yes Nicolette Bang, DO  fluticasone (FLONASE) 50 MCG/ACT nasal spray Place 2 sprays into both nostrils daily. 01/15/21  Yes Khady Stain, MD  fluticasone (FLOVENT HFA) 44 MCG/ACT inhaler Inhale 2 puffs into the lungs in the morning and at bedtime. 01/15/21  Yes Hope Stain, MD  glucose blood (ACCU-CHEK GUIDE) test strip Use to check blood  sugar once daily. 09/27/20  Yes Charlott Rakes, MD  glycopyrrolate (ROBINUL) 2 MG tablet Take 1 tablet (2 mg total) by mouth 2 (two) times daily as needed (cramping/bloat). 09/05/20  Yes Esterwood, Amy S, PA-C  hydrOXYzine (ATARAX/VISTARIL) 10 MG tablet Take 10 mg by mouth at bedtime.  09/04/19  Yes [provider]  metFORMIN (GLUCOPHAGE) 850 MG tablet TAKE 1 TABLET (850 MG TOTAL) BY MOUTH 2 (TWO) TIMES DAILY WITH A MEAL. 11/13/20  Yes Nicolette Bang, DO  montelukast (SINGULAIR) 10 MG tablet Take 1 tablet (10 mg total) by mouth at bedtime. 01/15/21  Yes Desi Stain, MD  pantoprazole (PROTONIX) 40 MG tablet Take 1 tablet (40 mg total) by mouth daily. 01/15/21  Yes Aaradhya Stain, MD  prazosin (MINIPRESS) 1 MG capsule Take 1 mg by mouth at bedtime.  05/30/20  Yes [provider]  sertraline (ZOLOFT) 50 MG tablet Take 3 tablets (150 mg total) by mouth daily. 10/28/20  Yes Nicolette Bang, DO  traZODone (DESYREL) 50 MG tablet Take 0.5-1 tablets (25-50 mg total) by mouth at bedtime as needed for sleep. 10/28/20  Yes Nicolette Bang, DO   Past Medical History:  Diagnosis Date  . Allergy    Phreesia 07/07/2020  . Anemia   . Anxiety    Phreesia 07/07/2020  . Asthma    Phreesia 07/07/2020  . Cardiac arrhythmia   . Depression with anxiety   . Diabetes mellitus without complication ([place])    Phreesia 07/07/2020  . Exercise-induced asthma   . Hearing  deficit, bilateral   . IBS (irritable bowel syndrome)   . Migraines   . Morbid obesity ([place])   . Prediabetes   . S/P laparoscopic cholecystectomy   . Seasonal allergies    Past Surgical History:  Procedure Laterality Date  . CHOLECYSTECTOMY  02/2015   Family History  Problem Relation Age of Onset  . Hypertension Mother   . Asthma Mother   . Diabetes Father   . Hypertension Father   . Kidney disease Father   . Seizures Brother   . Other Brother        smooth brain disease  . Seizures  Maternal Grandmother   . COPD Maternal Grandmother   . Asthma Maternal Grandmother   . Lung cancer Maternal Grandfather   . Kidney disease Maternal Grandfather   . Breast cancer Paternal Grandmother   . Colon cancer Neg Hx   . Colon polyps Neg Hx   . Stomach cancer Neg Hx   . Rectal cancer Neg Hx   . Esophageal cancer Neg Hx    Social History   Socioeconomic History  . Marital status: Single    Spouse name: Not on file  . Number of children: 0  . Years of education: Not on file  . Highest education level: Not on file  Occupational History  . Occupation: Ship broker  Tobacco Use  . Smoking status: Never Smoker  . Smokeless tobacco: Never Used  Vaping Use  . Vaping Use: Never used  Substance and Sexual Activity  . Alcohol use: Never  . Drug use: Never  . Sexual activity: Yes    Birth control/protection: Implant  Other Topics Concern  . Not on file  Social History Narrative  . Not on file   Social Determinants of Health   Financial Resource Strain: Not on file  Food Insecurity: Not on file  Transportation Needs: Not on file  Physical Activity: Not on file  Stress: Not on file  Social Connections: Not on file  Intimate Partner Violence: Not on file   ROS-see HPI   + = positive Constitutional:    weight loss, night sweats, fevers, chills,+ fatigue, lassitude. HEENT:    headaches, difficulty swallowing, tooth/dental problems, sore throat,       sneezing, itching, ear ache, nasal congestion, post nasal drip, snoring CV:    chest pain, orthopnea, PND, swelling in lower extremities, anasarca,                                   dizziness, +palpitations Resp:   shortness of breath with exertion or at rest.                productive cough,   non-productive cough, coughing up of blood.              change in color of mucus.  +wheezing.   Skin:    rash or lesions. GI:  +heartburn, indigestion, abdominal pain, nausea, vomiting, diarrhea,                 change in bowel habits,  loss of appetite GU: dysuria, change in color of urine, no urgency or frequency.   flank pain. MS:   joint pain, stiffness, decreased range of motion, back pain. Neuro-     nothing unusual Psych:  change in mood or affect.  depression or anxiety.   memory loss.  OBJ- Physical Exam General- Alert, Oriented, Affect-appropriate, Distress-  none acute, + obese Skin- rash-none, lesions- none, excoriation- none Lymphadenopathy- none Head- atraumatic            Eyes- Gross vision intact, PERRLA, conjunctivae and secretions clear            Ears- Hearing, canals-normal            Nose- Clear, no-Septal dev, mucus, polyps, erosion, perforation             Throat- Mallampati IV , mucosa clear , drainage- none, tonsils- atrophic, + teeth Neck- flexible , trachea midline, no stridor , thyroid nl, carotid no bruit Chest - symmetrical excursion , unlabored           Heart/CV- RRR , no murmur , no gallop  , no rub, nl s1 s2                           - JVD- none , edema- none, stasis changes- none, varices- none           Lung- clear to P&A, wheeze- none, cough- none , dullness-none, rub- none           Chest wall-  Abd-  Br/ Gen/ Rectal- Not done, not indicated Extrem- cyanosis- none, clubbing, none, atrophy- none, strength- nl Neuro- grossly intact to observation

## 2021-01-28 ENCOUNTER — Other Ambulatory Visit: Payer: Self-pay | Admitting: Internal Medicine

## 2021-01-28 DIAGNOSIS — R7303 Prediabetes: Secondary | ICD-10-CM

## 2021-01-31 NOTE — Telephone Encounter (Signed)
Pt scheduled for appt on 2/28 to discuss

## 2021-02-03 ENCOUNTER — Ambulatory Visit: Payer: Self-pay

## 2021-02-03 ENCOUNTER — Encounter: Payer: Self-pay | Admitting: *Deleted

## 2021-02-03 ENCOUNTER — Other Ambulatory Visit: Payer: Self-pay

## 2021-02-03 ENCOUNTER — Ambulatory Visit: Payer: Medicaid Other | Admitting: Internal Medicine

## 2021-02-03 ENCOUNTER — Ambulatory Visit
Admission: EM | Admit: 2021-02-03 | Discharge: 2021-02-03 | Disposition: A | Discharge status: Home / Self Care | Payer: Medicaid Other | Attending: Emergency Medicine | Admitting: Emergency Medicine

## 2021-02-03 DIAGNOSIS — R1084 Generalized abdominal pain: Secondary | ICD-10-CM | POA: Diagnosis not present

## 2021-02-03 DIAGNOSIS — R197 Diarrhea, unspecified: Secondary | ICD-10-CM

## 2021-02-03 DIAGNOSIS — R112 Nausea with vomiting, unspecified: Secondary | ICD-10-CM | POA: Diagnosis not present

## 2021-02-03 MED ORDER — ONDANSETRON 4 MG PO TBDP: 4.0000 mg | ORAL_TABLET | Freq: Three times a day (TID) | ORAL | 0 refills | Status: DC | PRN

## 2021-02-03 MED ORDER — DICYCLOMINE HCL 20 MG PO TABS: 20.0000 mg | ORAL_TABLET | Freq: Three times a day (TID) | ORAL | 0 refills | Status: DC

## 2021-02-03 NOTE — ED Triage Notes (Signed)
Pt reports N/V and diarrhea since Friday. Pt can not keep liquids down.

## 2021-02-03 NOTE — Discharge Instructions (Signed)
Please use Zofran 1 to 2 tablets as needed every 8 hours for nausea and vomiting Focus on rehydration, push fluids May try Bentyl/dicyclomine before meals and bedtime for cramping Slowly transition diet back to normal If stools are extremely frequent may use Imodium or Pepto-Bismol over-the-counter Follow-up if any symptoms not improving or worsening

## 2021-02-04 ENCOUNTER — Emergency Department (HOSPITAL_COMMUNITY): Payer: Medicaid Other

## 2021-02-04 ENCOUNTER — Emergency Department (HOSPITAL_COMMUNITY)
Admission: EM | Admit: 2021-02-04 | Discharge: 2021-02-04 | Disposition: A | Discharge status: Home / Self Care | Payer: Medicaid Other | Attending: Emergency Medicine | Admitting: Emergency Medicine

## 2021-02-04 ENCOUNTER — Other Ambulatory Visit: Payer: Self-pay

## 2021-02-04 ENCOUNTER — Encounter (HOSPITAL_COMMUNITY): Payer: Self-pay

## 2021-02-04 DIAGNOSIS — K589 Irritable bowel syndrome without diarrhea: Secondary | ICD-10-CM | POA: Insufficient documentation

## 2021-02-04 DIAGNOSIS — R112 Nausea with vomiting, unspecified: Secondary | ICD-10-CM | POA: Insufficient documentation

## 2021-02-04 DIAGNOSIS — Z7984 Long term (current) use of oral hypoglycemic drugs: Secondary | ICD-10-CM | POA: Diagnosis not present

## 2021-02-04 DIAGNOSIS — J45990 Exercise induced bronchospasm: Secondary | ICD-10-CM | POA: Insufficient documentation

## 2021-02-04 DIAGNOSIS — E119 Type 2 diabetes mellitus without complications: Secondary | ICD-10-CM | POA: Insufficient documentation

## 2021-02-04 DIAGNOSIS — E282 Polycystic ovarian syndrome: Secondary | ICD-10-CM | POA: Insufficient documentation

## 2021-02-04 DIAGNOSIS — R109 Unspecified abdominal pain: Secondary | ICD-10-CM

## 2021-02-04 DIAGNOSIS — R197 Diarrhea, unspecified: Secondary | ICD-10-CM

## 2021-02-04 DIAGNOSIS — R1031 Right lower quadrant pain: Secondary | ICD-10-CM | POA: Insufficient documentation

## 2021-02-04 LAB — CBC WITH DIFFERENTIAL/PLATELET
Abs Immature Granulocytes: 0.02 10*3/uL (ref 0.00–0.07)
Basophils Absolute: 0 10*3/uL (ref 0.0–0.1)
Basophils Relative: 0 %
Eosinophils Absolute: 0.1 10*3/uL (ref 0.0–0.5)
Eosinophils Relative: 2 %
HCT: 39.1 % (ref 36.0–46.0)
Hemoglobin: 12.3 g/dL (ref 12.0–15.0)
Immature Granulocytes: 0 %
Lymphocytes Relative: 28 %
Lymphs Abs: 1.3 10*3/uL (ref 0.7–4.0)
MCH: 25.5 pg — ABNORMAL LOW (ref 26.0–34.0)
MCHC: 31.5 g/dL (ref 30.0–36.0)
MCV: 81 fL (ref 80.0–100.0)
Monocytes Absolute: 0.6 10*3/uL (ref 0.1–1.0)
Monocytes Relative: 11 %
Neutro Abs: 2.8 10*3/uL (ref 1.7–7.7)
Neutrophils Relative %: 59 %
Platelets: 307 10*3/uL (ref 150–400)
RBC: 4.83 MIL/uL (ref 3.87–5.11)
RDW: 13.6 % (ref 11.5–15.5)
WBC: 4.8 10*3/uL (ref 4.0–10.5)
nRBC: 0 % (ref 0.0–0.2)

## 2021-02-04 LAB — COMPREHENSIVE METABOLIC PANEL
ALT: 35 U/L (ref 0–44)
AST: 26 U/L (ref 15–41)
Albumin: 4 g/dL (ref 3.5–5.0)
Alkaline Phosphatase: 74 U/L (ref 38–126)
Anion gap: 12 (ref 5–15)
BUN: 9 mg/dL (ref 6–20)
CO2: 25 mmol/L (ref 22–32)
Calcium: 9.1 mg/dL (ref 8.9–10.3)
Chloride: 102 mmol/L (ref 98–111)
Creatinine, Ser: 0.85 mg/dL (ref 0.44–1.00)
GFR, Estimated: >60 mL/min (ref >60)
Glucose, Bld: 101 mg/dL — ABNORMAL HIGH (ref 70–99)
Potassium: 3.8 mmol/L (ref 3.5–5.1)
Sodium: 139 mmol/L (ref 135–145)
Total Bilirubin: 0.3 mg/dL (ref 0.3–1.2)
Total Protein: 7.8 g/dL (ref 6.5–8.1)

## 2021-02-04 LAB — I-STAT BETA HCG BLOOD, ED (MC, WL, AP ONLY): I-stat hCG, quantitative: <5 m[IU]/mL (ref <5)

## 2021-02-04 LAB — LIPASE, BLOOD: Lipase: 23 U/L (ref 11–51)

## 2021-02-04 MED ORDER — ONDANSETRON 4 MG PO TBDP: 4.0000 mg | ORAL_TABLET | Freq: Three times a day (TID) | ORAL | 0 refills | Status: DC | PRN

## 2021-02-04 MED ORDER — KETOROLAC TROMETHAMINE 30 MG/ML IJ SOLN
30.0000 mg | Freq: Once | INTRAMUSCULAR | Status: AC
  Administered 2021-02-04: 30 mg via INTRAVENOUS
  Filled 2021-02-04: qty 1

## 2021-02-04 MED ORDER — ONDANSETRON HCL 4 MG/2ML IJ SOLN
4.0000 mg | Freq: Once | INTRAMUSCULAR | Status: AC
  Administered 2021-02-04: 4 mg via INTRAVENOUS
  Filled 2021-02-04: qty 2

## 2021-02-04 MED ORDER — IOHEXOL 300 MG/ML  SOLN
100.0000 mL | Freq: Once | INTRAMUSCULAR | Status: AC | PRN
  Administered 2021-02-04: 100 mL via INTRAVENOUS

## 2021-02-04 MED ORDER — SODIUM CHLORIDE 0.9 % IV BOLUS
1000.0000 mL | Freq: Once | INTRAVENOUS | Status: AC
Start: 2021-02-04 — End: 2021-02-04
  Administered 2021-02-04: 1000 mL via INTRAVENOUS

## 2021-02-04 MED ORDER — DICYCLOMINE HCL 20 MG PO TABS: 20.0000 mg | ORAL_TABLET | Freq: Four times a day (QID) | ORAL | 0 refills | Status: DC | PRN

## 2021-02-04 NOTE — Discharge Instructions (Addendum)
Please read instructions below. Drink clear liquids until your stomach feels better. Then, slowly introduce bland foods into your diet as tolerated, such as bread, rice, apples, bananas. You can take zofran every 8 hours as needed for nausea. You can take the Bentyl every 6 hours for abdominal cramping. Follow up with your primary care if symptoms persist. Return to the ER for severe abdominal pain, fever, uncontrollable vomiting, or new or concerning symptoms.

## 2021-02-04 NOTE — ED Triage Notes (Signed)
Pt presents with c/o N/V/D since Friday as well as abdominal pain. Pt reports she went to UC yesterday and was diagnosed with the stomach flu.

## 2021-02-04 NOTE — ED Provider Notes (Signed)
[facility] DEPT Provider Note   CSN: [id number] Arrival date & time: 02/04/21  1529     History Chief Complaint  Patient presents with  . Abdominal Pain    Sara Cervantes is a 20 y.o. female w PMHx IBS, DM, asthma, cholecystectomy, PCOS, presenting to the ED with complaint of right-sided abdominal pain with associated nausea, vomiting, and diarrhea.  Symptoms began on Friday.  She states her mother had a foodborne illness after eating a similar meal as she did.  She states her mother however has been feeling better.  She continues to feel ill and has abdominal pain.  Pain is to the right abdomen, worse after eating.  Mostly to the right lower abdomen.  Reports about 4 episodes of vomiting per day and about 8 episodes of loose stools.  Treated her symptoms with some over-the-counter medication for nausea without much relief.  No fevers or chills.  No urinary symptoms.  No pelvic complaints.  She went to urgent care yesterday who diagnosed her with gastroenteritis.  The history is provided by the patient.       Past Medical History:  Diagnosis Date  . Allergy    Phreesia 07/07/2020  . Anemia   . Anxiety    Phreesia 07/07/2020  . Asthma    Phreesia 07/07/2020  . Cardiac arrhythmia   . Depression with anxiety   . Diabetes mellitus without complication ([place])   . Exercise-induced asthma   . Hearing deficit, bilateral   . IBS (irritable bowel syndrome)   . Migraines   . Morbid obesity ([place])   . Prediabetes   . S/P laparoscopic cholecystectomy   . Seasonal allergies     Patient Active Problem List   Diagnosis Date Noted  . Snoring 01/20/2021  . GERD (gastroesophageal reflux disease) 01/15/2021  . Allergic rhinitis 01/15/2021  . OSA (obstructive sleep apnea) 01/15/2021  . Polycystic ovarian syndrome 10/28/2020  . Prediabetes 05/07/2020  . Bilateral hearing loss 02/06/2019  . Depression with anxiety 11/04/2018  . Environmental and  seasonal allergies 11/04/2018  . Exercise-induced asthma 11/04/2018  . Migraine without aura and without status migrainosus, not intractable 11/04/2018  . Obesity, Class III, BMI 40-49.9 (morbid obesity) ([place]) 11/04/2018  . Irritable bowel syndrome 03/03/2018  . S/P laparoscopic cholecystectomy 02/06/2017    Past Surgical History:  Procedure Laterality Date  . CHOLECYSTECTOMY  02/2015     OB History    Gravida  0   Para  0   Term  0   Preterm  0   AB  0   Living  0     SAB  0   IAB  0   Ectopic  0   Multiple  0   Live Births  0           Family History  Problem Relation Age of Onset  . Hypertension Mother   . Asthma Mother   . Diabetes Father   . Hypertension Father   . Kidney disease Father   . Seizures Brother   . Other Brother        smooth brain disease  . Seizures Maternal Grandmother   . COPD Maternal Grandmother   . Asthma Maternal Grandmother   . Lung cancer Maternal Grandfather   . Kidney disease Maternal Grandfather   . Breast cancer Paternal Grandmother   . Colon cancer Neg Hx   . Colon polyps Neg Hx   . Stomach cancer Neg Hx   .  Rectal cancer Neg Hx   . Esophageal cancer Neg Hx     Social History   Tobacco Use  . Smoking status: Never Smoker  . Smokeless tobacco: Never Used  Vaping Use  . Vaping Use: Never used  Substance Use Topics  . Alcohol use: Never  . Drug use: Never    Home Medications Prior to Admission medications   Medication Sig Start Date End Date Taking? Authorizing Provider  dicyclomine (BENTYL) 20 MG tablet Take 1 tablet (20 mg total) by mouth 4 (four) times daily as needed for spasms. 02/04/21  Yes Quentin Cornwall, [place] N, PA-C  ondansetron (ZOFRAN ODT) 4 MG disintegrating tablet Take 1 tablet (4 mg total) by mouth every 8 (eight) hours as needed for nausea or vomiting. 02/04/21  Yes , [place] N, PA-C  Accu-Chek Softclix Lancets lancets Use to check blood sugar once daily. 09/27/20   Charlott Rakes, MD   albuterol (VENTOLIN HFA) 108 (90 Base) MCG/ACT inhaler Inhale 2 puffs into the lungs every 6 (six) hours as needed for wheezing or shortness of breath. 01/15/21   Doranne Stain, MD  Blood Glucose Monitoring Suppl (ACCU-CHEK GUIDE ME) w/Device KIT Use to check blood sugar once daily. 09/27/20   Charlott Rakes, MD  busPIRone (BUSPAR) 5 MG tablet Take 1 tablet (5 mg total) by mouth 2 (two) times daily. 10/28/20   Nicolette Bang, DO  dicyclomine (BENTYL) 20 MG tablet Take 1 tablet (20 mg total) by mouth 4 (four) times daily -  before meals and at bedtime. 02/03/21   Wieters, Hallie C, PA-C  eletriptan (RELPAX) 20 MG tablet Take 20 mg by mouth 2 (two) times daily as needed. 12/02/19   [provider]  ferrous sulfate 324 (65 Fe) MG TBEC Take 1 tablet (325 mg total) by mouth every other day. 01/02/21   Nicolette Bang, DO  fluticasone (FLONASE) 50 MCG/ACT nasal spray Place 2 sprays into both nostrils daily. 01/15/21   Trine Stain, MD  fluticasone (FLOVENT HFA) 44 MCG/ACT inhaler Inhale 2 puffs into the lungs in the morning and at bedtime. 01/15/21   Kilyn Stain, MD  glucose blood (ACCU-CHEK GUIDE) test strip Use to check blood sugar once daily. 09/27/20   Charlott Rakes, MD  glycopyrrolate (ROBINUL) 2 MG tablet Take 1 tablet (2 mg total) by mouth 2 (two) times daily as needed (cramping/bloat). 09/05/20   Esterwood, Amy S, PA-C  hydrOXYzine (ATARAX/VISTARIL) 10 MG tablet Take 10 mg by mouth at bedtime.  09/04/19   [provider]  metFORMIN (GLUCOPHAGE) 850 MG tablet TAKE 1 TABLET (850 MG TOTAL) BY MOUTH 2 (TWO) TIMES DAILY WITH A MEAL. 01/30/21   Nicolette Bang, DO  montelukast (SINGULAIR) 10 MG tablet Take 1 tablet (10 mg total) by mouth at bedtime. 01/15/21   Aerilyn Stain, MD  ondansetron (ZOFRAN ODT) 4 MG disintegrating tablet Take 1-2 tablets (4-8 mg total) by mouth every 8 (eight) hours as needed for nausea or vomiting. 02/03/21   Wieters,  Hallie C, PA-C  pantoprazole (PROTONIX) 40 MG tablet Take 1 tablet (40 mg total) by mouth daily. 01/15/21   Jariyah Stain, MD  prazosin (MINIPRESS) 1 MG capsule Take 1 mg by mouth at bedtime.  05/30/20   [provider]  sertraline (ZOLOFT) 50 MG tablet Take 3 tablets (150 mg total) by mouth daily. 10/28/20   Nicolette Bang, DO  traZODone (DESYREL) 50 MG tablet Take 0.5-1 tablets (25-50 mg total) by mouth at bedtime  as needed for sleep. 10/28/20   Nicolette Bang, DO    Allergies    Penicillins  Review of Systems   Review of Systems  Gastrointestinal: Positive for abdominal pain, diarrhea, nausea and vomiting.  All other systems reviewed and are negative.   Physical Exam Updated Vital Signs BP 137/80 (BP Location: Right Arm)   Pulse 74   Temp 98.7 F (37.1 C) (Oral)   Resp 18   Ht '5\' 5"'  (1.651 m)   Wt 133.8 kg   LMP 01/20/2021 Comment: upt negative  SpO2 98%   BMI 49.09 kg/m   Physical Exam Vitals and nursing note reviewed.  Constitutional:      General: She is not in acute distress.    Appearance: She is well-developed and well-nourished. She is obese. She is not ill-appearing.  HENT:     Head: Normocephalic and atraumatic.  Eyes:     Conjunctiva/sclera: Conjunctivae normal.  Cardiovascular:     Rate and Rhythm: Normal rate and regular rhythm.  Pulmonary:     Effort: Pulmonary effort is normal. No respiratory distress.     Breath sounds: Normal breath sounds.  Abdominal:     General: Bowel sounds are normal.     Palpations: Abdomen is soft.     Tenderness: There is abdominal tenderness in the right upper quadrant, right lower quadrant and suprapubic area.  Skin:    General: Skin is warm.  Neurological:     Mental Status: She is alert.  Psychiatric:        Mood and Affect: Mood and affect normal.        Behavior: Behavior normal.     ED Results / Procedures / Treatments   Labs (all labs ordered are listed, but only abnormal  results are displayed) Labs Reviewed  COMPREHENSIVE METABOLIC PANEL - Abnormal; Notable for the following components:      Result Value   Glucose, Bld 101 (*)    All other components within normal limits  CBC WITH DIFFERENTIAL/PLATELET - Abnormal; Notable for the following components:   MCH 25.5 (*)    All other components within normal limits  LIPASE, BLOOD  I-STAT BETA HCG BLOOD, ED ([facility], [facility], [facility] ONLY)    EKG None  Radiology CT Abdomen Pelvis W Contrast  Result Date: 02/04/2021 CLINICAL DATA:  Right lower quadrant pain EXAM: CT ABDOMEN AND PELVIS WITH CONTRAST TECHNIQUE: Multidetector CT imaging of the abdomen and pelvis was performed using the standard protocol following bolus administration of intravenous contrast. CONTRAST:  145m OMNIPAQUE IOHEXOL 300 MG/ML  SOLN COMPARISON:  None. FINDINGS: Lower chest: The visualized heart size within normal limits. No pericardial fluid/thickening. No hiatal hernia. The visualized portions of the lungs are clear. Hepatobiliary: The liver is normal in density without focal abnormality.The main portal vein is patent. The patient is status post cholecystectomy. Pancreas: Unremarkable. No pancreatic ductal dilatation or surrounding inflammatory changes. Spleen: Normal in size without focal abnormality. Adrenals/Urinary Tract: Both adrenal glands appear normal. There is posterior calcifications seen in the lower pole of the right kidney measuring 9 mm with mild right lower pole pelviectasis. No ureteral calculi are noted. No left-sided renal calculus are seen. The bladder is unremarkable. Stomach/Bowel: The stomach, small bowel, and colon are normal in appearance. No inflammatory changes, wall thickening, or obstructive findings.The appendix is normal. Vascular/Lymphatic: There are no enlarged mesenteric, retroperitoneal, or pelvic lymph nodes. No significant vascular findings are present. Reproductive: The uterus and adnexa are unremarkable. Other: No evidence of  abdominal wall mass or hernia. Musculoskeletal: No acute or significant osseous findings. IMPRESSION: Normal appearing appendix. Right renal lower pole calculus with mild pelviectasis. Electronically Signed   By: Prudencio Pair M.D.   On: 02/04/2021 18:49    Procedures Procedures   Medications Ordered in ED Medications  sodium chloride 0.9 % bolus 1,000 mL (1,000 mLs Intravenous New Bag/Given 02/04/21 1650)  ondansetron (ZOFRAN) injection 4 mg (4 mg Intravenous Given 02/04/21 1650)  iohexol (OMNIPAQUE) 300 MG/ML solution 100 mL (100 mLs Intravenous Contrast Given 02/04/21 1814)  ketorolac (TORADOL) 30 MG/ML injection 30 mg (30 mg Intravenous Given 02/04/21 1942)    ED Course  I have reviewed the triage vital signs and the nursing notes.  Pertinent labs & imaging results that were available during my care of the patient were reviewed by me and considered in my medical decision making (see chart for details).    MDM Rules/Calculators/A&P                          Patient presenting with persisting right-sided abdominal pain with associated nausea, vomiting, diarrhea that began on Friday.  Her mother has similar symptoms and patient suspected was foodborne illness, however she presented for persisting abdominal discomfort.  Blood work is unremarkable. Considering location of pain in the right side of the abdomen, there is concern for appendicitis.  CT scan however is negative for any acute findings.  There is noted to be persisting right renal calculus now measuring 9 mm in size.  This is discussed with patient and recommend nonemergent outpatient follow-up with urology.  Symptoms are improved on reevaluation.   Recommend clear liquid diet for bowel rest and slowly advance diet as tolerated.  Will prescribe antiemetic and Bentyl for symptoms, PCP follow-up recommended.  She is discharged in no acute distress.   Discussed results, findings, treatment and follow up. Patient advised of return precautions.  Patient verbalized understanding and agreed with plan.  Final Clinical Impression(s) / ED Diagnoses Final diagnoses:  Nausea vomiting and diarrhea  Right sided abdominal pain    Rx / DC Orders ED Discharge Orders         Ordered    dicyclomine (BENTYL) 20 MG tablet  4 times daily PRN        02/04/21 1955    ondansetron (ZOFRAN ODT) 4 MG disintegrating tablet  Every 8 hours PRN        02/04/21 1955           , [place] N, PA-C 02/04/21 1955    Breck Coons, MD 02/06/21 1154

## 2021-02-05 ENCOUNTER — Telehealth: Payer: Self-pay

## 2021-02-05 NOTE — ED Provider Notes (Signed)
[facility]    CSN: [id number] Arrival date & time: 02/03/21  1443      History   Chief Complaint Chief Complaint  Patient presents with  . Emesis  . Nausea  . Diarrhea    HPI Sara Cervantes is a 20 y.o. female presenting today for evaluation of nausea vomiting and diarrhea.  Symptoms began Friday, approximately 3 to 4 days ago.  Reports poor oral intake due to vomiting.  Denies blood in stool or vomit.  Has had some abdominal pain and cramping.  Has had prior cholecystectomy.  Denies urinary symptoms of dysuria, increased frequency or urgency.  Denies close sick contacts.  Denies associated URI symptoms.  HPI  Past Medical History:  Diagnosis Date  . Allergy    Phreesia 07/07/2020  . Anemia   . Anxiety    Phreesia 07/07/2020  . Asthma    Phreesia 07/07/2020  . Cardiac arrhythmia   . Depression with anxiety   . Diabetes mellitus without complication ([place])   . Exercise-induced asthma   . Hearing deficit, bilateral   . IBS (irritable bowel syndrome)   . Migraines   . Morbid obesity ([place])   . Prediabetes   . S/P laparoscopic cholecystectomy   . Seasonal allergies     Patient Active Problem List   Diagnosis Date Noted  . Snoring 01/20/2021  . GERD (gastroesophageal reflux disease) 01/15/2021  . Allergic rhinitis 01/15/2021  . OSA (obstructive sleep apnea) 01/15/2021  . Polycystic ovarian syndrome 10/28/2020  . Prediabetes 05/07/2020  . Bilateral hearing loss 02/06/2019  . Depression with anxiety 11/04/2018  . Environmental and seasonal allergies 11/04/2018  . Exercise-induced asthma 11/04/2018  . Migraine without aura and without status migrainosus, not intractable 11/04/2018  . Obesity, Class III, BMI 40-49.9 (morbid obesity) ([place]) 11/04/2018  . Irritable bowel syndrome 03/03/2018  . S/P laparoscopic cholecystectomy 02/06/2017    Past Surgical History:  Procedure Laterality Date  . CHOLECYSTECTOMY  02/2015    OB History    Gravida   0   Para  0   Term  0   Preterm  0   AB  0   Living  0     SAB  0   IAB  0   Ectopic  0   Multiple  0   Live Births  0            Home Medications    Prior to Admission medications   Medication Sig Start Date End Date Taking? Authorizing Provider  dicyclomine (BENTYL) 20 MG tablet Take 1 tablet (20 mg total) by mouth 4 (four) times daily -  before meals and at bedtime. 02/03/21  Yes Matrice Herro C, PA-C  ondansetron (ZOFRAN ODT) 4 MG disintegrating tablet Take 1-2 tablets (4-8 mg total) by mouth every 8 (eight) hours as needed for nausea or vomiting. 02/03/21  Yes Tirza Senteno C, PA-C  Accu-Chek Softclix Lancets lancets Use to check blood sugar once daily. 09/27/20   Charlott Rakes, MD  albuterol (VENTOLIN HFA) 108 (90 Base) MCG/ACT inhaler Inhale 2 puffs into the lungs every 6 (six) hours as needed for wheezing or shortness of breath. 01/15/21   Marlaina Stain, MD  Blood Glucose Monitoring Suppl (ACCU-CHEK GUIDE ME) w/Device KIT Use to check blood sugar once daily. 09/27/20   Charlott Rakes, MD  busPIRone (BUSPAR) 5 MG tablet Take 1 tablet (5 mg total) by mouth 2 (two) times daily. 10/28/20   Nicolette Bang, DO  dicyclomine (  BENTYL) 20 MG tablet Take 1 tablet (20 mg total) by mouth 4 (four) times daily as needed for spasms. 02/04/21   Robinson, [place] N, PA-C  eletriptan (RELPAX) 20 MG tablet Take 20 mg by mouth 2 (two) times daily as needed. 12/02/19   [provider]  ferrous sulfate 324 (65 Fe) MG TBEC Take 1 tablet (325 mg total) by mouth every other day. 01/02/21   Nicolette Bang, DO  fluticasone (FLONASE) 50 MCG/ACT nasal spray Place 2 sprays into both nostrils daily. 01/15/21   Ashlan Stain, MD  fluticasone (FLOVENT HFA) 44 MCG/ACT inhaler Inhale 2 puffs into the lungs in the morning and at bedtime. 01/15/21   Yassmine Stain, MD  glucose blood (ACCU-CHEK GUIDE) test strip Use to check blood sugar once daily. 09/27/20    Charlott Rakes, MD  glycopyrrolate (ROBINUL) 2 MG tablet Take 1 tablet (2 mg total) by mouth 2 (two) times daily as needed (cramping/bloat). 09/05/20   Esterwood, Amy S, PA-C  hydrOXYzine (ATARAX/VISTARIL) 10 MG tablet Take 10 mg by mouth at bedtime.  09/04/19   [provider]  metFORMIN (GLUCOPHAGE) 850 MG tablet TAKE 1 TABLET (850 MG TOTAL) BY MOUTH 2 (TWO) TIMES DAILY WITH A MEAL. 01/30/21   Nicolette Bang, DO  montelukast (SINGULAIR) 10 MG tablet Take 1 tablet (10 mg total) by mouth at bedtime. 01/15/21   Wisdom Stain, MD  ondansetron (ZOFRAN ODT) 4 MG disintegrating tablet Take 1 tablet (4 mg total) by mouth every 8 (eight) hours as needed for nausea or vomiting. 02/04/21   Robinson, [place] N, PA-C  pantoprazole (PROTONIX) 40 MG tablet Take 1 tablet (40 mg total) by mouth daily. 01/15/21   Dhriti Stain, MD  prazosin (MINIPRESS) 1 MG capsule Take 1 mg by mouth at bedtime.  05/30/20   [provider]  sertraline (ZOLOFT) 50 MG tablet Take 3 tablets (150 mg total) by mouth daily. 10/28/20   Nicolette Bang, DO  traZODone (DESYREL) 50 MG tablet Take 0.5-1 tablets (25-50 mg total) by mouth at bedtime as needed for sleep. 10/28/20   Nicolette Bang, DO    Family History Family History  Problem Relation Age of Onset  . Hypertension Mother   . Asthma Mother   . Diabetes Father   . Hypertension Father   . Kidney disease Father   . Seizures Brother   . Other Brother        smooth brain disease  . Seizures Maternal Grandmother   . COPD Maternal Grandmother   . Asthma Maternal Grandmother   . Lung cancer Maternal Grandfather   . Kidney disease Maternal Grandfather   . Breast cancer Paternal Grandmother   . Colon cancer Neg Hx   . Colon polyps Neg Hx   . Stomach cancer Neg Hx   . Rectal cancer Neg Hx   . Esophageal cancer Neg Hx     Social History Social History   Tobacco Use  . Smoking status: Never Smoker  . Smokeless tobacco:  Never Used  Vaping Use  . Vaping Use: Never used  Substance Use Topics  . Alcohol use: Never  . Drug use: Never     Allergies   Penicillins   Review of Systems Review of Systems  Constitutional: Negative for activity change, appetite change, chills, fatigue and fever.  HENT: Negative for congestion, ear pain, rhinorrhea, sinus pressure, sore throat and trouble swallowing.   Eyes: Negative for discharge and redness.  Respiratory: Negative  for cough, chest tightness and shortness of breath.   Cardiovascular: Negative for chest pain.  Gastrointestinal: Positive for abdominal pain, diarrhea, nausea and vomiting.  Musculoskeletal: Negative for myalgias.  Skin: Negative for rash.  Neurological: Negative for dizziness, light-headedness and headaches.     Physical Exam Triage Vital Signs ED Triage Vitals  Enc Vitals Group     BP 02/03/21 1602 117/71     Pulse Rate 02/03/21 1602 86     Resp 02/03/21 1602 16     Temp 02/03/21 1602 98.8 F (37.1 C)     Temp Source 02/03/21 1602 Oral     SpO2 02/03/21 1602 96 %     Weight --      Height --      Head Circumference --      Peak Flow --      Pain Score 02/03/21 1605 5     Pain Loc --      Pain Edu? --      Excl. in [place]? --    No data found.  Updated Vital Signs BP 117/71 (BP Location: Left Arm)   Pulse 86   Temp 98.8 F (37.1 C) (Oral)   Resp 16   LMP 01/20/2021   SpO2 96%   Visual Acuity Right Eye Distance:   Left Eye Distance:   Bilateral Distance:    Right Eye Near:   Left Eye Near:    Bilateral Near:     Physical Exam Vitals and nursing note reviewed.  Constitutional:      Appearance: She is well-developed and well-nourished.     Comments: No acute distress  HENT:     Head: Normocephalic and atraumatic.     Nose: Nose normal.     Mouth/Throat:     Comments: Oral mucosa pink and moist, no tonsillar enlargement or exudate. Posterior pharynx patent and nonerythematous, no uvula deviation or swelling.  Normal phonation. Eyes:     Conjunctiva/sclera: Conjunctivae normal.  Cardiovascular:     Rate and Rhythm: Normal rate.  Pulmonary:     Effort: Pulmonary effort is normal. No respiratory distress.     Comments: Breathing comfortably at rest, CTABL, no wheezing, rales or other adventitious sounds auscultated  Abdominal:     General: There is no distension.     Comments: Soft, nondistended, generalized tenderness throughout abdomen, negative rebound, negative Rovsing, negative McBurney's  Musculoskeletal:        General: Normal range of motion.     Cervical back: Neck supple.  Skin:    General: Skin is warm and dry.  Neurological:     Mental Status: She is alert and oriented to person, place, and time.  Psychiatric:        Mood and Affect: Mood and affect normal.      UC Treatments / Results  Labs (all labs ordered are listed, but only abnormal results are displayed) Labs Reviewed - No data to display  EKG   Radiology CT Abdomen Pelvis W Contrast  Result Date: 02/04/2021 CLINICAL DATA:  Right lower quadrant pain EXAM: CT ABDOMEN AND PELVIS WITH CONTRAST TECHNIQUE: Multidetector CT imaging of the abdomen and pelvis was performed using the standard protocol following bolus administration of intravenous contrast. CONTRAST:  157m OMNIPAQUE IOHEXOL 300 MG/ML  SOLN COMPARISON:  None. FINDINGS: Lower chest: The visualized heart size within normal limits. No pericardial fluid/thickening. No hiatal hernia. The visualized portions of the lungs are clear. Hepatobiliary: The liver is normal in density without focal abnormality.The  main portal vein is patent. The patient is status post cholecystectomy. Pancreas: Unremarkable. No pancreatic ductal dilatation or surrounding inflammatory changes. Spleen: Normal in size without focal abnormality. Adrenals/Urinary Tract: Both adrenal glands appear normal. There is posterior calcifications seen in the lower pole of the right kidney measuring 9 mm with  mild right lower pole pelviectasis. No ureteral calculi are noted. No left-sided renal calculus are seen. The bladder is unremarkable. Stomach/Bowel: The stomach, small bowel, and colon are normal in appearance. No inflammatory changes, wall thickening, or obstructive findings.The appendix is normal. Vascular/Lymphatic: There are no enlarged mesenteric, retroperitoneal, or pelvic lymph nodes. No significant vascular findings are present. Reproductive: The uterus and adnexa are unremarkable. Other: No evidence of abdominal wall mass or hernia. Musculoskeletal: No acute or significant osseous findings. IMPRESSION: Normal appearing appendix. Right renal lower pole calculus with mild pelviectasis. Electronically Signed   By: Prudencio Pair M.D.   On: 02/04/2021 18:49    Procedures Procedures (including critical care time)  Medications Ordered in UC Medications - No data to display  Initial Impression / Assessment and Plan / UC Course  I have reviewed the triage vital signs and the nursing notes.  Pertinent labs & imaging results that were available during my care of the patient were reviewed by me and considered in my medical decision making (see chart for details).    4 days of nausea vomiting diarrhea with abdominal pain-negative peritoneal signs, no focal tenderness, suspect likely viral gastroenteritis and recommending continued symptomatic and supportive care.  Providing Zofran to use to suppress nausea/vomiting and increase oral intake, encouraged oral rehydration and gradually transition diet.  We will do Bentyl as trial to help with abdominal cramping and discomfort in the meantime.  Advised to continue to monitor pain, to follow-up in emergency room for further evaluation of blood work and imaging if symptoms progressing or worsening.  Discussed strict return precautions. Patient verbalized understanding and is agreeable with plan.  Final Clinical Impressions(s) / UC Diagnoses   Final  diagnoses:  Nausea vomiting and diarrhea  Generalized abdominal pain     Discharge Instructions     Please use Zofran 1 to 2 tablets as needed every 8 hours for nausea and vomiting Focus on rehydration, push fluids May try Bentyl/dicyclomine before meals and bedtime for cramping Slowly transition diet back to normal If stools are extremely frequent may use Imodium or Pepto-Bismol over-the-counter Follow-up if any symptoms not improving or worsening   ED Prescriptions    Medication Sig Dispense Auth. Provider   ondansetron (ZOFRAN ODT) 4 MG disintegrating tablet Take 1-2 tablets (4-8 mg total) by mouth every 8 (eight) hours as needed for nausea or vomiting. 20 tablet Blen Ransome C, PA-C   dicyclomine (BENTYL) 20 MG tablet Take 1 tablet (20 mg total) by mouth 4 (four) times daily -  before meals and at bedtime. 20 tablet Cherylene Ferrufino, [place] C, PA-C     PDMP not reviewed this encounter.   Janith Lima, [place] 02/05/21 [phone]

## 2021-02-05 NOTE — Telephone Encounter (Signed)
Transition Care Management Follow-up Telephone Call  Date of discharge and from where: 02/04/2021 from [facility]  How have you been since you were released from the hospital? Pt states that she is feeling better and has no questions or concerns at this time.   Any questions or concerns? No  Items Reviewed:  Did the pt receive and understand the discharge instructions provided? Yes   Medications obtained and verified? Yes   Other? No   Any new allergies since your discharge? No   Dietary orders reviewed?   Do you have support at home? Yes   Functional Questionnaire: (I = Independent and D = Dependent) ADLs: I  Bathing/Dressing- I  Meal Prep- I  Eating- I  Maintaining continence- I  Transferring/Ambulation- I  Managing Meds- I  Follow up appointments reviewed:   PCP Hospital f/u appt confirmed? Yes  Scheduled to see Rudell Cobb, DO on 02/06/2021 @ 04:10pm.  Specialist Hospital f/u appt confirmed? No    Are transportation arrangements needed? No    If their condition worsens, is the pt aware to call PCP or go to the Emergency Dept.? Yes  Was the patient provided with contact information for the PCP's office or ED? Yes  Was to pt encouraged to call back with questions or concerns? Yes

## 2021-02-06 ENCOUNTER — Telehealth: Payer: Medicaid Other | Admitting: Internal Medicine

## 2021-02-22 ENCOUNTER — Encounter: Payer: Self-pay | Admitting: *Deleted

## 2021-02-22 ENCOUNTER — Ambulatory Visit
Admission: EM | Admit: 2021-02-22 | Discharge: 2021-02-22 | Disposition: A | Discharge status: Home / Self Care | Payer: Medicaid Other | Attending: Emergency Medicine | Admitting: Emergency Medicine

## 2021-02-22 ENCOUNTER — Other Ambulatory Visit: Payer: Self-pay

## 2021-02-22 DIAGNOSIS — J039 Acute tonsillitis, unspecified: Secondary | ICD-10-CM

## 2021-02-22 LAB — POCT RAPID STREP A (OFFICE): Rapid Strep A Screen: NEGATIVE

## 2021-02-22 MED ORDER — IBUPROFEN 800 MG PO TABS: 800.0000 mg | ORAL_TABLET | Freq: Three times a day (TID) | ORAL | 0 refills | Status: DC

## 2021-02-22 MED ORDER — AZITHROMYCIN 250 MG PO TABS: 500.0000 mg | ORAL_TABLET | Freq: Every day | ORAL | 0 refills | Status: AC

## 2021-02-22 NOTE — Discharge Instructions (Addendum)
Begin azithromycin 500 mg daily for the next 5 days Use anti-inflammatories for pain/swelling. You may take up to 800 mg Ibuprofen every 8 hours with food. You may supplement Ibuprofen with Tylenol [phone] mg every 8 hours.  Follow-up if not improving or worsening

## 2021-02-22 NOTE — ED Provider Notes (Signed)
[facility]    CSN: [id number] Arrival date & time: 02/22/21  1119      History   Chief Complaint Chief Complaint  Patient presents with  . Sore Throat    HPI Sara Cervantes is a 20 y.o. female history of asthma presenting today for evaluation of a sore throat.  Reports 2 to 3 days of sore throat which has been progressively worsening.  Feels swelling in throat.  Denies any associated congestion or cough.  Denies fevers.  Not using any medicines.  Denies close sick contacts.  HPI  Past Medical History:  Diagnosis Date  . Allergy    Phreesia 07/07/2020  . Anemia   . Anxiety    Phreesia 07/07/2020  . Asthma    Phreesia 07/07/2020  . Cardiac arrhythmia   . Depression with anxiety   . Diabetes mellitus without complication ([place])   . Exercise-induced asthma   . Hearing deficit, bilateral   . IBS (irritable bowel syndrome)   . Migraines   . Morbid obesity ([place])   . Prediabetes   . S/P laparoscopic cholecystectomy   . Seasonal allergies     Patient Active Problem List   Diagnosis Date Noted  . Snoring 01/20/2021  . GERD (gastroesophageal reflux disease) 01/15/2021  . Allergic rhinitis 01/15/2021  . OSA (obstructive sleep apnea) 01/15/2021  . Polycystic ovarian syndrome 10/28/2020  . Prediabetes 05/07/2020  . Bilateral hearing loss 02/06/2019  . Depression with anxiety 11/04/2018  . Environmental and seasonal allergies 11/04/2018  . Exercise-induced asthma 11/04/2018  . Migraine without aura and without status migrainosus, not intractable 11/04/2018  . Obesity, Class III, BMI 40-49.9 (morbid obesity) ([place]) 11/04/2018  . Irritable bowel syndrome 03/03/2018  . S/P laparoscopic cholecystectomy 02/06/2017    Past Surgical History:  Procedure Laterality Date  . CHOLECYSTECTOMY  02/2015    OB History    Gravida  0   Para  0   Term  0   Preterm  0   AB  0   Living  0     SAB  0   IAB  0   Ectopic  0   Multiple  0   Live  Births  0            Home Medications    Prior to Admission medications   Medication Sig Start Date End Date Taking? Authorizing Provider  albuterol (VENTOLIN HFA) 108 (90 Base) MCG/ACT inhaler Inhale 2 puffs into the lungs every 6 (six) hours as needed for wheezing or shortness of breath. 01/15/21  Yes Edye Stain, MD  azithromycin (ZITHROMAX) 250 MG tablet Take 2 tablets (500 mg total) by mouth daily for 5 days. Take first 2 tablets together, then 1 every day until finished. 02/22/21 02/27/21 Yes Ling Flesch C, PA-C  busPIRone (BUSPAR) 5 MG tablet Take 1 tablet (5 mg total) by mouth 2 (two) times daily. 10/28/20  Yes Nicolette Bang, DO  dicyclomine (BENTYL) 20 MG tablet Take 1 tablet (20 mg total) by mouth 4 (four) times daily as needed for spasms. 02/04/21  Yes Robinson, [place] N, PA-C  eletriptan (RELPAX) 20 MG tablet Take 20 mg by mouth 2 (two) times daily as needed. 12/02/19  Yes [provider]  ferrous sulfate 324 (65 Fe) MG TBEC Take 1 tablet (325 mg total) by mouth every other day. 01/02/21  Yes Nicolette Bang, DO  fluticasone (FLOVENT HFA) 44 MCG/ACT inhaler Inhale 2 puffs into the lungs in the morning  and at bedtime. 01/15/21  Yes Keriann Stain, MD  glycopyrrolate (ROBINUL) 2 MG tablet Take 1 tablet (2 mg total) by mouth 2 (two) times daily as needed (cramping/bloat). 09/05/20  Yes Esterwood, Amy S, PA-C  hydrOXYzine (ATARAX/VISTARIL) 10 MG tablet Take 10 mg by mouth at bedtime.  09/04/19  Yes [provider]  ibuprofen (ADVIL) 800 MG tablet Take 1 tablet (800 mg total) by mouth 3 (three) times daily. 02/22/21  Yes Mancil Pfenning C, PA-C  metFORMIN (GLUCOPHAGE) 850 MG tablet TAKE 1 TABLET (850 MG TOTAL) BY MOUTH 2 (TWO) TIMES DAILY WITH A MEAL. 01/30/21  Yes Nicolette Bang, DO  montelukast (SINGULAIR) 10 MG tablet Take 1 tablet (10 mg total) by mouth at bedtime. 01/15/21  Yes Regene Stain, MD  pantoprazole (PROTONIX) 40 MG  tablet Take 1 tablet (40 mg total) by mouth daily. 01/15/21  Yes Falecia Stain, MD  prazosin (MINIPRESS) 1 MG capsule Take 1 mg by mouth at bedtime.  05/30/20  Yes [provider]  sertraline (ZOLOFT) 50 MG tablet Take 3 tablets (150 mg total) by mouth daily. 10/28/20  Yes Nicolette Bang, DO  traZODone (DESYREL) 50 MG tablet Take 0.5-1 tablets (25-50 mg total) by mouth at bedtime as needed for sleep. 10/28/20  Yes Nicolette Bang, DO  Accu-Chek Softclix Lancets lancets Use to check blood sugar once daily. 09/27/20   Charlott Rakes, MD  Blood Glucose Monitoring Suppl (ACCU-CHEK GUIDE ME) w/Device KIT Use to check blood sugar once daily. 09/27/20   Charlott Rakes, MD  glucose blood (ACCU-CHEK GUIDE) test strip Use to check blood sugar once daily. 09/27/20   Charlott Rakes, MD  fluticasone (FLONASE) 50 MCG/ACT nasal spray Place 2 sprays into both nostrils daily. 01/15/21 02/22/21  Shirlee Stain, MD    Family History Family History  Problem Relation Age of Onset  . Hypertension Mother   . Asthma Mother   . Diabetes Father   . Hypertension Father   . Kidney disease Father   . Seizures Brother   . Other Brother        smooth brain disease  . Seizures Maternal Grandmother   . COPD Maternal Grandmother   . Asthma Maternal Grandmother   . Lung cancer Maternal Grandfather   . Kidney disease Maternal Grandfather   . Breast cancer Paternal Grandmother   . Colon cancer Neg Hx   . Colon polyps Neg Hx   . Stomach cancer Neg Hx   . Rectal cancer Neg Hx   . Esophageal cancer Neg Hx     Social History Social History   Tobacco Use  . Smoking status: Never Smoker  . Smokeless tobacco: Never Used  Vaping Use  . Vaping Use: Never used  Substance Use Topics  . Alcohol use: Never  . Drug use: Never     Allergies   Penicillins   Review of Systems Review of Systems  Constitutional: Negative for activity change, appetite change, chills, fatigue and fever.   HENT: Positive for sore throat. Negative for congestion, ear pain, rhinorrhea, sinus pressure and trouble swallowing.   Eyes: Negative for discharge and redness.  Respiratory: Negative for cough, chest tightness and shortness of breath.   Cardiovascular: Negative for chest pain.  Gastrointestinal: Negative for abdominal pain, diarrhea, nausea and vomiting.  Musculoskeletal: Negative for myalgias.  Skin: Negative for rash.  Neurological: Negative for dizziness, light-headedness and headaches.     Physical Exam Triage Vital Signs ED Triage Vitals  Enc  Vitals Group     BP      Pulse      Resp      Temp      Temp src      SpO2      Weight      Height      Head Circumference      Peak Flow      Pain Score      Pain Loc      Pain Edu?      Excl. in [place]?    No data found.  Updated Vital Signs BP 111/76   Pulse 88   Temp 97.7 F (36.5 C) (Temporal)   Resp 18   LMP 01/26/2021 (Exact Date)   SpO2 96%   Visual Acuity Right Eye Distance:   Left Eye Distance:   Bilateral Distance:    Right Eye Near:   Left Eye Near:    Bilateral Near:     Physical Exam Vitals and nursing note reviewed.  Constitutional:      Appearance: She is well-developed.     Comments: No acute distress  HENT:     Head: Normocephalic and atraumatic.     Ears:     Comments: Bilateral ears without tenderness to palpation of external auricle, tragus and mastoid, EAC's without erythema or swelling, TM's with good bony landmarks and cone of light. Non erythematous.     Nose: Nose normal.     Mouth/Throat:     Comments: Oral mucosa pink and moist, bilateral tonsillar areas appear swollen and erythematous, exudative noted to superior portion on right side, no soft palate swelling, uvula midline without deviation or swelling Eyes:     Conjunctiva/sclera: Conjunctivae normal.  Cardiovascular:     Rate and Rhythm: Normal rate.  Pulmonary:     Effort: Pulmonary effort is normal. No respiratory  distress.     Comments: Breathing comfortably at rest, CTABL, no wheezing, rales or other adventitious sounds auscultated Abdominal:     General: There is no distension.  Musculoskeletal:        General: Normal range of motion.     Cervical back: Neck supple.  Skin:    General: Skin is warm and dry.  Neurological:     Mental Status: She is alert and oriented to person, place, and time.      UC Treatments / Results  Labs (all labs ordered are listed, but only abnormal results are displayed) Labs Reviewed  CULTURE, GROUP A STREP [facility])  POCT RAPID STREP A (OFFICE)    EKG   Radiology No results found.  Procedures Procedures (including critical care time)  Medications Ordered in UC Medications - No data to display  Initial Impression / Assessment and Plan / UC Course  I have reviewed the triage vital signs and the nursing notes.  Pertinent labs & imaging results that were available during my care of the patient were reviewed by me and considered in my medical decision making (see chart for details).     Strep negative, given appearance of tonsils treating for tonsillitis and continuing on antibiotics.  Has anaphylaxis with penicillins, will proceed with azithromycin 500 mg daily for 5 days, continue anti-inflammatories for pain and swelling.  Push fluids.  Continue to monitor.  Discussed strict return precautions. Patient verbalized understanding and is agreeable with plan.  Final Clinical Impressions(s) / UC Diagnoses   Final diagnoses:  Acute tonsillitis, unspecified etiology     Discharge Instructions  Begin azithromycin 500 mg daily for the next 5 days Use anti-inflammatories for pain/swelling. You may take up to 800 mg Ibuprofen every 8 hours with food. You may supplement Ibuprofen with Tylenol [phone] mg every 8 hours.  Follow-up if not improving or worsening     ED Prescriptions    Medication Sig Dispense Auth. Provider   azithromycin (ZITHROMAX)  250 MG tablet Take 2 tablets (500 mg total) by mouth daily for 5 days. Take first 2 tablets together, then 1 every day until finished. 10 tablet Anjelica Gorniak C, PA-C   ibuprofen (ADVIL) 800 MG tablet Take 1 tablet (800 mg total) by mouth 3 (three) times daily. 21 tablet Drianna Chandran, [place] C, PA-C     PDMP not reviewed this encounter.   Janith Lima, PA-C 02/22/21 1238

## 2021-02-22 NOTE — ED Triage Notes (Signed)
C/O waking up with sore throat onset 2 days ago.  C/O painful talking and swallowing.  Denies fevers or any other sxs.  Has not been taking any measures to help alleviate pain.

## 2021-02-24 LAB — CULTURE, GROUP A STREP (THRC)

## 2021-02-25 DIAGNOSIS — J039 Acute tonsillitis, unspecified: Secondary | ICD-10-CM | POA: Insufficient documentation

## 2021-02-26 ENCOUNTER — Other Ambulatory Visit: Payer: Self-pay

## 2021-02-26 ENCOUNTER — Encounter: Payer: Self-pay | Admitting: Critical Care Medicine

## 2021-02-26 ENCOUNTER — Ambulatory Visit: Payer: Medicaid Other | Attending: Critical Care Medicine | Admitting: Critical Care Medicine

## 2021-02-26 DIAGNOSIS — J45990 Exercise induced bronchospasm: Secondary | ICD-10-CM | POA: Diagnosis not present

## 2021-02-26 DIAGNOSIS — J4520 Mild intermittent asthma, uncomplicated: Secondary | ICD-10-CM | POA: Insufficient documentation

## 2021-02-26 DIAGNOSIS — K219 Gastro-esophageal reflux disease without esophagitis: Secondary | ICD-10-CM

## 2021-02-26 DIAGNOSIS — F39 Unspecified mood [affective] disorder: Secondary | ICD-10-CM | POA: Insufficient documentation

## 2021-02-26 DIAGNOSIS — J0301 Acute recurrent streptococcal tonsillitis: Secondary | ICD-10-CM

## 2021-02-26 MED ORDER — AEROCHAMBER MV MISC: 0 refills | Status: AC

## 2021-02-26 NOTE — Assessment & Plan Note (Signed)
Acute tonsillitis continue course of azithromycin  Keep appointment with ENT for tonsillectomy

## 2021-02-26 NOTE — Assessment & Plan Note (Signed)
Continue Singulair use albuterol prior to intense exercise resume Flovent once tonsillitis improved use AeroChamber this was issued

## 2021-02-26 NOTE — Assessment & Plan Note (Signed)
Continue reflux therapy

## 2021-02-26 NOTE — Patient Instructions (Signed)
Use spacer device for your albuterol For intense exercise take 2 puffs 30 minutes prior to the exercise. Hold Flovent for now till after your tonsil surgery If you worsen after finishing azithromycin message me and we will give you another antibiotic for your throat Stay on your acid suppressing medication Stay on Singulair Video visit will be scheduled mid-May I have put a letter in the system you can download that letter which gives you allowance to perform the 12-minute vigorous exercise

## 2021-02-26 NOTE — Progress Notes (Signed)
Subjective:    Patient ID: Sara Cervantes, female    DOB: 21-Oct-2001, 21 y.o.   MRN: [id number] Virtual Visit via Video Note  I connected with@ on 02/26/21 at@ by a video enabled telemedicine application and verified that I am speaking with the correct person using two identifiers.   Consent:  I discussed the limitations, risks, security and privacy concerns of performing an evaluation and management service by video visit and the availability of in person appointments. I also discussed with the patient that there may be a patient responsible charge related to this service. The patient expressed understanding and agreed to proceed.  Location of patient: Patient is in her dorm room  Location of provider: I am in my office  Persons participating in the televisit with the patient.   No one else on the call    History of Present Illness: 20 y.o.F hx Asthma ref from Dr Juleen China  01/15/2021 This is a patient referred by Dr. Juleen China for exercise-induced asthma.  This patient has a history since adolescence of exercise-induced asthma when she does sport activities.  More recently her weight has increased significantly she now has increased snoring daytime hypersomnolence increased fatigue dry mouth headaches lack of concentration and focus.  She is not been evaluated by sleep physicians recently.  She is yet to have a sleep study.  She does have a sleep study pending at this time is not yet been performed.  The patient does tend to eat a lot of red meat is trying to improve her diet and is back on an exercise program.  When she does try to exercise she has significant asthma symptoms.  See asthma assessment below.  02/26/2021 Patient seen for a video visit with good video connection.  She has had increasing sore throat was diagnosed with acute tonsillitis finishing course of azithromycin and has seen ENT who plans tonsillectomy April 29.  Patient otherwise is doing well blood sugar in the 98-1  15 range she is not had to use her rescue inhaler because of her throat she cannot use the Flovent inhaler.  She needs clearance to be able to do 12 minutes of vigorous exercise GERD (gastroesophageal reflux disease) Reflux disease likely contributing  Reflux diet given and will begin pantoprazole daily  Exercise-induced asthma Exercise-induced asthma worsening is a patient gets older and gained more weight  Reflux likely contributing Probably allergic factors with severe nasal turbinate edema  Probable sleep apnea as well  Begin Flonase 2 sprays each nostril daily, Flovent 44 mcg strength 2 puffs twice daily, albuterol as needed, begin Singulair daily , spacer device given for HFA inhalers and patient instructed as to proper HFA technique  Peak flow meter given to the patient demonstrated as to his use patient to record numbers current value right now is about 300 but she does not display a full effort  May yet benefit from an allergy evaluation  Allergic rhinitis Start Flonase  Obesity, Class III, BMI 40-49.9 (morbid obesity) ([place]) Went over a diet plan is patient to encourage weight loss  Environmental and seasonal allergies Begin Flonase yet will need allergy eval  OSA (obstructive sleep apnea) Keep planned sleep study appointment and we will go ahead and refer to sleep medicine   Diagnoses and all orders for this visit:  Exercise-induced asthma  OSA (obstructive sleep apnea) -     Ambulatory referral to Pulmonology  Gastroesophageal reflux disease without esophagitis  Allergic rhinitis, unspecified seasonality, unspecified trigger  Obesity,  Class III, BMI 40-49.9 (morbid obesity) (HCC)  Environmental and seasonal allergies  Other orders -     albuterol (VENTOLIN HFA) 108 (90 Base) MCG/ACT inhaler; Inhale 2 puffs into the lungs every 6 (six) hours as needed for wheezing or shortness of breath. -     fluticasone (FLOVENT HFA) 44 MCG/ACT inhaler; Inhale 2 puffs  into the lungs in the morning and at bedtime. -     montelukast (SINGULAIR) 10 MG tablet; Take 1 tablet (10 mg total) by mouth at bedtime. -     Discontinue: fluticasone (FLONASE) 50 MCG/ACT nasal spray; Place 2 sprays into both nostrils daily. -     pantoprazole (PROTONIX) 40 MG tablet; Take 1 tablet (40 mg total) by mouth daily.      Asthma She complains of chest tightness and frequent throat clearing. There is no cough, difficulty breathing, hemoptysis, hoarse voice, shortness of breath, sputum production or wheezing. The current episode started more than 1 year ago. The problem occurs every several days (exercise induced only ). The problem has been gradually improving. The cough is non-productive and dry. Associated symptoms include malaise/fatigue, myalgias and a sore throat. Pertinent negatives include no appetite change, chest pain, ear congestion, ear pain, fever, headaches, heartburn, nasal congestion, orthopnea, PND, postnasal drip, rhinorrhea, sneezing, trouble swallowing or weight loss. Her symptoms are aggravated by any activity, exercise, change in weather, pollen, emotional stress, exposure to smoke, exposure to fumes, URI, animal exposure, climbing stairs, lying down and strenuous activity. Her symptoms are alleviated by beta-agonist and steroid inhaler. She reports moderate improvement on treatment. Her past medical history is significant for asthma. There is no history of pneumonia.   Past Medical History:  Diagnosis Date  . Allergy    Phreesia 07/07/2020  . Anemia   . Anxiety    Phreesia 07/07/2020  . Asthma    Phreesia 07/07/2020  . Cardiac arrhythmia   . Depression with anxiety   . Diabetes mellitus without complication ([place])   . Exercise-induced asthma   . Hearing deficit, bilateral   . IBS (irritable bowel syndrome)   . Migraines   . Morbid obesity ([place])   . Prediabetes   . S/P laparoscopic cholecystectomy   . Seasonal allergies      Family History  Problem  Relation Age of Onset  . Hypertension Mother   . Asthma Mother   . Diabetes Father   . Hypertension Father   . Kidney disease Father   . Seizures Brother   . Other Brother        smooth brain disease  . Seizures Maternal Grandmother   . COPD Maternal Grandmother   . Asthma Maternal Grandmother   . Lung cancer Maternal Grandfather   . Kidney disease Maternal Grandfather   . Breast cancer Paternal Grandmother   . Colon cancer Neg Hx   . Colon polyps Neg Hx   . Stomach cancer Neg Hx   . Rectal cancer Neg Hx   . Esophageal cancer Neg Hx      Social History   Socioeconomic History  . Marital status: Single    Spouse name: Not on file  . Number of children: 0  . Years of education: Not on file  . Highest education level: Not on file  Occupational History  . Occupation: Ship broker  Tobacco Use  . Smoking status: Never Smoker  . Smokeless tobacco: Never Used  Vaping Use  . Vaping Use: Never used  Substance and Sexual Activity  . Alcohol  use: Never  . Drug use: Never  . Sexual activity: Yes    Birth control/protection: None  Other Topics Concern  . Not on file  Social History Narrative  . Not on file   Social Determinants of Health   Financial Resource Strain: Not on file  Food Insecurity: Not on file  Transportation Needs: Not on file  Physical Activity: Not on file  Stress: Not on file  Social Connections: Not on file  Intimate Partner Violence: Not on file     Allergies  Allergen Reactions  . Penicillins Anaphylaxis and Hives     Outpatient Medications Prior to Visit  Medication Sig Dispense Refill  . Accu-Chek Softclix Lancets lancets Use to check blood sugar once daily. 100 each 2  . albuterol (VENTOLIN HFA) 108 (90 Base) MCG/ACT inhaler Inhale 2 puffs into the lungs every 6 (six) hours as needed for wheezing or shortness of breath. 18 g 1  . azithromycin (ZITHROMAX) 250 MG tablet Take 2 tablets (500 mg total) by mouth daily for 5 days. Take first 2 tablets  together, then 1 every day until finished. 10 tablet 0  . Blood Glucose Monitoring Suppl (ACCU-CHEK GUIDE ME) w/Device KIT Use to check blood sugar once daily. 1 kit 0  . busPIRone (BUSPAR) 5 MG tablet Take 1 tablet (5 mg total) by mouth 2 (two) times daily. 180 tablet 1  . dicyclomine (BENTYL) 20 MG tablet Take 1 tablet (20 mg total) by mouth 4 (four) times daily as needed for spasms. 12 tablet 0  . eletriptan (RELPAX) 20 MG tablet Take 20 mg by mouth 2 (two) times daily as needed.    . ferrous sulfate 324 (65 Fe) MG TBEC Take 1 tablet (325 mg total) by mouth every other day. 90 tablet 0  . glucose blood (ACCU-CHEK GUIDE) test strip Use to check blood sugar once daily. 100 each 2  . glycopyrrolate (ROBINUL) 2 MG tablet Take 1 tablet (2 mg total) by mouth 2 (two) times daily as needed (cramping/bloat). 60 tablet 6  . [name] 24 FE 1-20 MG-MCG(24) tablet Take 1 tablet by mouth daily.    . hydrOXYzine (ATARAX/VISTARIL) 10 MG tablet Take 10 mg by mouth at bedtime.     Marland Kitchen ibuprofen (ADVIL) 800 MG tablet Take 1 tablet (800 mg total) by mouth 3 (three) times daily. 21 tablet 0  . metFORMIN (GLUCOPHAGE) 850 MG tablet TAKE 1 TABLET (850 MG TOTAL) BY MOUTH 2 (TWO) TIMES DAILY WITH A MEAL. 180 tablet 0  . montelukast (SINGULAIR) 10 MG tablet Take 1 tablet (10 mg total) by mouth at bedtime. 30 tablet 3  . ondansetron (ZOFRAN-ODT) 4 MG disintegrating tablet DISSOLE 1 TABLET IN MOUTH EVERY 8 HOURS AS NEEDED FOR NAUSEA OR VOMITING    . pantoprazole (PROTONIX) 40 MG tablet Take 1 tablet (40 mg total) by mouth daily. 30 tablet 3  . phenazopyridine (PYRIDIUM) 200 MG tablet TAKE 1 TABLET BY MOUTH 3 TIMES A DAY FOR 2 DAYS AFTER MEALS    . prazosin (MINIPRESS) 1 MG capsule Take 1 mg by mouth at bedtime.     . sertraline (ZOLOFT) 100 MG tablet Take 100 mg by mouth daily.    . sertraline (ZOLOFT) 50 MG tablet Take 3 tablets (150 mg total) by mouth daily. 270 tablet 1  . traZODone (DESYREL) 50 MG tablet Take 0.5-1 tablets  (25-50 mg total) by mouth at bedtime as needed for sleep. 90 tablet 1  . albuterol (VENTOLIN HFA) 108 (90 Base)  MCG/ACT inhaler 2 puff(s)    . fluticasone (FLOVENT HFA) 44 MCG/ACT inhaler Inhale 2 puffs into the lungs in the morning and at bedtime. (Patient not taking: Reported on 02/26/2021) 1 each 12   No facility-administered medications prior to visit.      Review of Systems  Constitutional: Positive for malaise/fatigue. Negative for appetite change, fever and weight loss.  HENT: Positive for sore throat. Negative for ear pain, hoarse voice, postnasal drip, rhinorrhea, sneezing and trouble swallowing.   Respiratory: Negative for cough, hemoptysis, sputum production, shortness of breath and wheezing.   Cardiovascular: Negative for chest pain and PND.  Gastrointestinal: Negative for heartburn.  Musculoskeletal: Positive for myalgias.  Neurological: Negative for headaches.       Objective:   Physical Exam There were no vitals filed for this visit.  No exam video visit      Assessment & Plan:  I personally reviewed all images and lab data in the [facility] system as well as any outside material available during this office visit and agree with the  radiology impressions.   Acute tonsillitis Acute tonsillitis continue course of azithromycin  Keep appointment with ENT for tonsillectomy  Exercise-induced asthma Continue Singulair use albuterol prior to intense exercise resume Flovent once tonsillitis improved use AeroChamber this was issued  GERD (gastroesophageal reflux disease) Continue reflux therapy   Diagnoses and all orders for this visit:  Acute recurrent streptococcal tonsillitis  Exercise-induced asthma  Gastroesophageal reflux disease without esophagitis  Other orders -     Spacer/Aero-Holding Chambers (AEROCHAMBER MV) inhaler; Use as instructed    Follow Up Instructions: Patient knows follow-up video visit will occur in 2 months   I discussed the assessment  and treatment plan with the patient. The patient was provided an opportunity to ask questions and all were answered. The patient agreed with the plan and demonstrated an understanding of the instructions.   The patient was advised to call back or seek an in-person evaluation if the symptoms worsen or if the condition fails to improve as anticipated.  I provided 75mnutes of non-face-to-face time during this encounter  including  median intraservice time , review of notes, labs, imaging, medications  and explaining diagnosis and management to the patient .    PAsencion Noble MD

## 2021-02-28 ENCOUNTER — Encounter: Payer: Self-pay | Admitting: Internal Medicine

## 2021-02-28 ENCOUNTER — Telehealth (INDEPENDENT_AMBULATORY_CARE_PROVIDER_SITE_OTHER): Payer: Medicaid Other | Admitting: Internal Medicine

## 2021-02-28 ENCOUNTER — Other Ambulatory Visit: Payer: Self-pay

## 2021-02-28 DIAGNOSIS — Z0289 Encounter for other administrative examinations: Secondary | ICD-10-CM | POA: Diagnosis not present

## 2021-02-28 DIAGNOSIS — J4520 Mild intermittent asthma, uncomplicated: Secondary | ICD-10-CM

## 2021-02-28 NOTE — Progress Notes (Signed)
Virtual Visit via Telephone Note  I connected with Sara Cervantes, on 02/28/2021 at 10:56 AM by telephone due to the COVID-19 pandemic and verified that I am speaking with the correct person using two identifiers.   Consent: I discussed the limitations, risks, security and privacy concerns of performing an evaluation and management service by telephone and the availability of in person appointments. I also discussed with the patient that there may be a patient responsible charge related to this service. The patient expressed understanding and agreed to proceed.   Location of Patient: Home   Location of Provider: Clinic    Persons participating in Telemedicine visit: Taliana Marie Smullen Valerie Blair-RN Dr. Wallace    History of Present Illness: Patient has a visit to ask for clearance for research study she is trying to participate in. She will be participating in treadmill running for periods of 15 minutes in self selected light to moderate intensity and second day would be more vigorous activity.    Past Medical History:  Diagnosis Date  . Allergy    Phreesia 07/07/2020  . Anemia   . Anxiety    Phreesia 07/07/2020  . Asthma    Phreesia 07/07/2020  . Cardiac arrhythmia   . Depression with anxiety   . Diabetes mellitus without complication (HCC)   . Exercise-induced asthma   . Hearing deficit, bilateral   . IBS (irritable bowel syndrome)   . Migraines   . Morbid obesity (HCC)   . Prediabetes   . S/P laparoscopic cholecystectomy   . Seasonal allergies    Allergies  Allergen Reactions  . Penicillins Anaphylaxis and Hives    Current Outpatient Medications on File Prior to Visit  Medication Sig Dispense Refill  . albuterol (VENTOLIN HFA) 108 (90 Base) MCG/ACT inhaler Inhale 2 puffs into the lungs every 6 (six) hours as needed for wheezing or shortness of breath. 18 g 1  . Blood Glucose Monitoring Suppl (ACCU-CHEK GUIDE ME) w/Device KIT Use to check blood sugar  once daily. 1 kit 0  . busPIRone (BUSPAR) 5 MG tablet Take 1 tablet (5 mg total) by mouth 2 (two) times daily. 180 tablet 1  . dicyclomine (BENTYL) 20 MG tablet Take 1 tablet (20 mg total) by mouth 4 (four) times daily as needed for spasms. 12 tablet 0  . eletriptan (RELPAX) 20 MG tablet Take 20 mg by mouth 2 (two) times daily as needed.    . ferrous sulfate 324 (65 Fe) MG TBEC Take 1 tablet (325 mg total) by mouth every other day. 90 tablet 0  . fluticasone (FLOVENT HFA) 44 MCG/ACT inhaler Inhale 2 puffs into the lungs in the morning and at bedtime. 1 each 12  . glycopyrrolate (ROBINUL) 2 MG tablet Take 1 tablet (2 mg total) by mouth 2 (two) times daily as needed (cramping/bloat). 60 tablet 6  . [name] 24 FE 1-20 MG-MCG(24) tablet Take 1 tablet by mouth daily.    . hydrOXYzine (ATARAX/VISTARIL) 10 MG tablet Take 10 mg by mouth at bedtime.     . ibuprofen (ADVIL) 800 MG tablet Take 1 tablet (800 mg total) by mouth 3 (three) times daily. 21 tablet 0  . metFORMIN (GLUCOPHAGE) 850 MG tablet TAKE 1 TABLET (850 MG TOTAL) BY MOUTH 2 (TWO) TIMES DAILY WITH A MEAL. 180 tablet 0  . montelukast (SINGULAIR) 10 MG tablet Take 1 tablet (10 mg total) by mouth at bedtime. 30 tablet 3  . ondansetron (ZOFRAN-ODT) 4 MG disintegrating tablet DISSOLE 1 TABLET IN   MOUTH EVERY 8 HOURS AS NEEDED FOR NAUSEA OR VOMITING    . pantoprazole (PROTONIX) 40 MG tablet Take 1 tablet (40 mg total) by mouth daily. 30 tablet 3  . phenazopyridine (PYRIDIUM) 200 MG tablet TAKE 1 TABLET BY MOUTH 3 TIMES A DAY FOR 2 DAYS AFTER MEALS    . prazosin (MINIPRESS) 1 MG capsule Take 1 mg by mouth at bedtime.     . sertraline (ZOLOFT) 100 MG tablet Take 100 mg by mouth daily.    . sertraline (ZOLOFT) 50 MG tablet Take 3 tablets (150 mg total) by mouth daily. 270 tablet 1  . traZODone (DESYREL) 50 MG tablet Take 0.5-1 tablets (25-50 mg total) by mouth at bedtime as needed for sleep. 90 tablet 1  . Accu-Chek Softclix Lancets lancets Use to check  blood sugar once daily. 100 each 2  . glucose blood (ACCU-CHEK GUIDE) test strip Use to check blood sugar once daily. 100 each 2  . Spacer/Aero-Holding Chambers (AEROCHAMBER MV) inhaler Use as instructed 1 each 0  . [DISCONTINUED] fluticasone (FLONASE) 50 MCG/ACT nasal spray Place 2 sprays into both nostrils daily. 16 g 6   No current facility-administered medications on file prior to visit.    Observations/Objective: NAD. Speaking clearly.  Work of breathing normal.  Alert and oriented. Mood appropriate.   Assessment and Plan: 1. Mild intermittent asthma without complication Recommended given her history of exercise induced asthma that she premedicate with Albuterol 30 minutes prior to exercise. Have also recommended that she be limited to the light to moderate physical activity.   2. Encounter for completion of form with patient Letter sent to patient for research study participation.    Follow Up Instructions: PRN and for routine medical care    I discussed the assessment and treatment plan with the patient. The patient was provided an opportunity to ask questions and all were answered. The patient agreed with the plan and demonstrated an understanding of the instructions.   The patient was advised to call back or seek an in-person evaluation if the symptoms worsen or if the condition fails to improve as anticipated.     I provided 8 minutes total of non-face-to-face time during this encounter including median intraservice time, reviewing previous notes, investigations, ordering medications, medical decision making, coordinating care and patient verbalized understanding at the end of the visit.    Catherine Wallace, D.O. [facility] [place]  02/28/2021, 10:56 AM 

## 2021-02-28 NOTE — Progress Notes (Signed)
Clearance for research study

## 2021-03-03 ENCOUNTER — Ambulatory Visit (INDEPENDENT_AMBULATORY_CARE_PROVIDER_SITE_OTHER): Payer: Medicaid Other | Admitting: Obstetrics and Gynecology

## 2021-03-03 ENCOUNTER — Encounter: Payer: Self-pay | Admitting: Obstetrics and Gynecology

## 2021-03-03 ENCOUNTER — Other Ambulatory Visit: Payer: Self-pay

## 2021-03-03 VITALS — BP 116/76 | HR 82 | Ht 65.0 in | Wt 279.0 lb

## 2021-03-03 DIAGNOSIS — Z3169 Encounter for other general counseling and advice on procreation: Secondary | ICD-10-CM

## 2021-03-03 NOTE — Progress Notes (Signed)
20 yo P0 with LMP 02/23/21 presenting today for follow up on AUB. AUB secondary to PCOS and patient was prescribed COC for medical management. Patient opted to discontinue COC as she is trying to conceive. Patient reports a normal period In February and March. She is taking metformin and working on weight loss management. Patient is without any complaints  Past Medical History:  Diagnosis Date  . Allergy    Phreesia 07/07/2020  . Anemia   . Anxiety    Phreesia 07/07/2020  . Asthma    Phreesia 07/07/2020  . Cardiac arrhythmia   . Depression with anxiety   . Diabetes mellitus without complication (H[facility])   . Exercise-induced asthma   . Hearing deficit, bilateral   . IBS (irritable bowel syndrome)   . Migraines   . Morbid obesity ([facility])   . Prediabetes   . S/P laparoscopic cholecystectomy   . Seasonal allergies    Past Surgical History:  Procedure Laterality Date  . CHOLECYSTECTOMY  02/2015   Family History  Problem Relation Age of Onset  . Hypertension Mother   . Asthma Mother   . Diabetes Father   . Hypertension Father   . Kidney disease Father   . Seizures Brother   . Other Brother        smooth brain disease  . Seizures Maternal Grandmother   . COPD Maternal Grandmother   . Asthma Maternal Grandmother   . Lung cancer Maternal Grandfather   . Kidney disease Maternal Grandfather   . Breast cancer Paternal Grandmother   . Colon cancer Neg Hx   . Colon polyps Neg Hx   . Stomach cancer Neg Hx   . Rectal cancer Neg Hx   . Esophageal cancer Neg Hx    Social History   Tobacco Use  . Smoking status: Never Smoker  . Smokeless tobacco: Never Used  Vaping Use  . Vaping Use: Never used  Substance Use Topics  . Alcohol use: Never  . Drug use: Never   ROS See pertinent in HPI. All other systems reviewed and non contributory  Blood pressure 116/76, pulse 82, height 5\' 5"  (1.651 m), weight 279 lb (126.6 kg), last menstrual period 02/23/2021. GENERAL: Well-developed,  well-nourished female in no acute distress.  EXTREMITIES: No cyanosis, clubbing, or edema, 2+ distal pulses. NEURO: alert and oriented x 3  A/P 20 yo with PCOS here for pre-conception counseling - Patient with A1c in prediabetic range. Patient encouraged to continue metformin and her weight loss program.  - Patient awaiting appointment with nutritionist - Discussed the benefits of ovulation predictor kits - Partner is in the 26 and healthy. He has never fathered a child - RTC in 6 months for annual exam with pap smear and conception update - Patient encouraged to take prenatal vitamins

## 2021-03-03 NOTE — Progress Notes (Signed)
Pt is here today for  f/u for PCOS. LMP: 02/23/21. Pt is not currently on anything for [facility]. Pt had urine cytology on 02/04/21 and chlamydia was negative.

## 2021-03-18 ENCOUNTER — Telehealth: Payer: Self-pay | Admitting: Internal Medicine

## 2021-03-18 DIAGNOSIS — G4733 Obstructive sleep apnea (adult) (pediatric): Secondary | ICD-10-CM

## 2021-03-18 DIAGNOSIS — R0683 Snoring: Secondary | ICD-10-CM

## 2021-03-18 NOTE — Telephone Encounter (Signed)
Called and spoke with patient. She stated that she was calling to follow up on the sleep study order from 01/20/21. AVS mentioned ordering a HST for patient but the order was never placed. Confirmed with patient that she still has Medicaid.   Spoke with Leola who stated that Medicaid patients can now receive HSTs. Will go ahead and order the HST as mentioned by CY back in February.   Patient is aware of this. I also explained the process of the HST. She verbalized understanding.   Nothing further needed at time of call.

## 2021-03-24 ENCOUNTER — Ambulatory Visit: Payer: Medicaid Other

## 2021-03-26 NOTE — H&P (Signed)
HPI:   Sara Cervantes is a 20 y.o. female who presents as a return Patient.   Current problem: Tonsils.  HPI: She developed acute tonsillitis about 4 5 days ago. It is one of the worst ones she has had. She is not able to eat much but she is able to drink liquids and eat some soft foods. Strep test was negative. She is on Z-Pak without much relief. She has 4-5 episodes of tonsillitis annually for the past 5 years. She is interested in tonsillectomy.  PMH/Meds/All/SocHx/FamHx/ROS:   Past Medical History:  Diagnosis Date  . Anxiety  . Asthma  . Depression  . GERD (gastroesophageal reflux disease)  . IBS (irritable bowel syndrome)  . Pre-diabetes   Past Surgical History:  Procedure Laterality Date  . CHOLECYSTECTOMY   No family history of bleeding disorders, wound healing problems or difficulty with anesthesia.   Social History   Socioeconomic History  . Marital status: Single  Spouse name: Not on file  . Number of children: Not on file  . Years of education: Not on file  . Highest education level: Not on file  Occupational History  . Not on file  Tobacco Use  . Smoking status: Never Smoker  . Smokeless tobacco: Never Used  Vaping Use  . Vaping Use: Never used  Substance and Sexual Activity  . Alcohol use: Never  . Drug use: Never  . Sexual activity: Not on file  Other Topics Concern  . Not on file  Social History Narrative  . Not on file   Social Determinants of Health   Financial Resource Strain: Not on file  Food Insecurity: Not on file  Transportation Needs: Not on file  Physical Activity: Not on file  Stress: Not on file  Social Connections: Not on file  Housing Stability: Not on file   Current Outpatient Medications:  . ACCU-CHEK SOFTCLIX LANCETS lancets, USE TO CHECK BLOOD SUGAR ONCE DAILY, Disp: , Rfl:  . albuterol 90 mcg/actuation inhaler, Inhale into the lungs., Disp: , Rfl:  . azithromycin (ZITHROMAX) 250 MG tablet, TAKE 2 TABLETS BY MOUTH EVERY  DAY FOR 5 DAYS, Disp: , Rfl:  . busPIRone (BUSPAR) 5 MG tablet, Take 5 mg by mouth 2 times daily., Disp: , Rfl:  . famotidine (PEPCID) 20 MG tablet, TAKE 1 TAB BY MOUTH TWICE A DAY. (FOR REFLUX), Disp: , Rfl:  . hydrOXYzine HCL (ATARAX) 10 MG tablet, Take by mouth., Disp: , Rfl:  . montelukast (SINGULAIR) 10 mg tablet, Take 10 mg by mouth nightly., Disp: , Rfl:  . ondansetron (ZOFRAN-ODT) 4 MG disintegrating tablet, DISSOLE 1 TABLET IN MOUTH EVERY 8 HOURS AS NEEDED FOR NAUSEA OR VOMITING, Disp: , Rfl:  . phenazopyridine (PYRIDIUM) 200 MG tablet, TAKE 1 TABLET BY MOUTH 3 TIMES A DAY FOR 2 DAYS AFTER MEALS, Disp: , Rfl:  . sertraline (ZOLOFT) 100 MG tablet, TAKE 1 TABLET BY MOUTH EVERY DAY, Disp: , Rfl:  . traZODone (DESYREL) 50 MG tablet, TAKE 0.5-1 TABLETS BY MOUTH AT BEDTIME AS NEEDED FOR SLEEP., Disp: , Rfl:    Physical Exam:   Obese young lady in no distress. Breathing and voice are clear. She has a very slight "hot potato" voice. There is some nontender bilateral level 2 adenopathy. Oral cavity and pharynx are clear. Tonsils are 2+ enlarged, with small areas of surface exudate. Ear canals drums and middle ears are healthy to inspection.  Independent Review of Additional Tests or Records:  none  Procedures:  none  Impression &  Plans:  Acute tonsillitis. Recommend supportive treatment including lots of fluids, warm saline gargles, over-the-counter analgesics. If she gets to where she is not able to drink then she should contact me for intravenous fluids and admission to the hospital. Otherwise just let this run its course as it is likely a viral infection.  For her chronic and recurring tonsillitis recommend tonsillectomy.Sara Cervantes meets the indications for tonsillectomy. Risks and benefits were discussed in detail. All questions were answered. A handout was provided with additional details.

## 2021-03-31 ENCOUNTER — Ambulatory Visit: Payer: Medicaid Other

## 2021-04-01 NOTE — Pre-Procedure Instructions (Signed)
Surgical Instructions    Your procedure is scheduled on Friday April 29th  Report to [facility] Main Entrance "A" at 0800 A.M., then check in with the Admitting office.  Call this number if you have problems the morning of surgery:  [phone]   If you have any questions prior to your surgery date call [phone]: Open Monday-Friday 8am-4pm    Remember:  Do not eat or drink after midnight the night before your surgery    Take these medicines the morning of surgery with A SIP OF WATER  busPIRone (BUSPAR)   famotidine (PEPCID)  fluticasone (FLOVENT HFA)   pantoprazole (PROTONIX)   albuterol (VENTOLIN HFA)- if needed  eletriptan (RELPAX)- if needed  fluticasone (FLONASE)- if needed Please bring all inhalers with you on day of surgery  As of today, STOP taking any Aspirin (unless otherwise instructed by your surgeon) Aleve, Naproxen, Ibuprofen, Motrin, Advil, Goody's, BC's, all herbal medications, fish oil, and all vitamins.   WHAT DO I DO ABOUT MY DIABETES MEDICATION?   Marland Kitchen Do not take metFORMIN (GLUCOPHAGE) the day of surgery   HOW TO MANAGE YOUR DIABETES BEFORE AND AFTER SURGERY  Why is it important to control my blood sugar before and after surgery? . Improving blood sugar levels before and after surgery helps healing and can limit problems. . A way of improving blood sugar control is eating a healthy diet by: o  Eating less sugar and carbohydrates o  Increasing activity/exercise o  Talking with your doctor about reaching your blood sugar goals . High blood sugars (greater than 180 mg/dL) can raise your risk of infections and slow your recovery, so you will need to focus on controlling your diabetes during the weeks before surgery. . Make sure that the doctor who takes care of your diabetes knows about your planned surgery including the date and location.  How do I manage my blood sugar before surgery? . Check your blood sugar at least 4 times a day, starting 2 days  before surgery, to make sure that the level is not too high or low. . Check your blood sugar the morning of your surgery when you wake up and every 2 hours until you get to the Short Stay unit. o If your blood sugar is less than 70 mg/dL, you will need to treat for low blood sugar: - Do not take insulin. - Treat a low blood sugar (less than 70 mg/dL) with  cup of clear juice (cranberry or apple), 4 glucose tablets, OR glucose gel. - Recheck blood sugar in 15 minutes after treatment (to make sure it is greater than 70 mg/dL). If your blood sugar is not greater than 70 mg/dL on recheck, call [phone] for further instructions. . Report your blood sugar to the short stay nurse when you get to Short Stay.  . If you are admitted to the hospital after surgery: o Your blood sugar will be checked by the staff and you will probably be given insulin after surgery (instead of oral diabetes medicines) to make sure you have good blood sugar levels. o The goal for blood sugar control after surgery is 80-180 mg/dL.              Do NOT Smoke (Tobacco/Vaping) or drink Alcohol 24 hours prior to your procedure.  If you use a CPAP at night, you may bring all equipment for your overnight stay.   Contacts, glasses, piercing's, hearing aid's, dentures or partials may not be worn into surgery, please  bring cases for these belongings.    For patients admitted to the hospital, discharge time will be determined by your treatment team.   Patients discharged the day of surgery will not be allowed to drive home, and someone needs to stay with them for 24 hours.    Special instructions:   [facility]- Preparing For Surgery  Before surgery, you can play an important role. Because skin is not sterile, your skin needs to be as free of germs as possible. You can reduce the number of germs on your skin by washing with CHG (chlorahexidine gluconate) Soap before surgery.  CHG is an antiseptic cleaner which kills germs and  bonds with the skin to continue killing germs even after washing.    Oral Hygiene is also important to reduce your risk of infection.  Remember - BRUSH YOUR TEETH THE MORNING OF SURGERY WITH YOUR REGULAR TOOTHPASTE  Please do not use if you have an allergy to CHG or antibacterial soaps. If your skin becomes reddened/irritated stop using the CHG.  Do not shave (including legs and underarms) for at least 48 hours prior to first CHG shower. It is OK to shave your face.  Please follow these instructions carefully.   1. Shower the NIGHT BEFORE SURGERY and the MORNING OF SURGERY  2. If you chose to wash your hair, wash your hair first as usual with your normal shampoo.  3. After you shampoo, rinse your hair and body thoroughly to remove the shampoo.  4. Use CHG Soap as you would any other liquid soap. You can apply CHG directly to the skin and wash gently with a scrungie or a clean washcloth.   5. Apply the CHG Soap to your body ONLY FROM THE NECK DOWN.  Do not use on open wounds or open sores. Avoid contact with your eyes, ears, mouth and genitals (private parts). Wash Face and genitals (private parts)  with your normal soap.   6. Wash thoroughly, paying special attention to the area where your surgery will be performed.  7. Thoroughly rinse your body with warm water from the neck down.  8. DO NOT shower/wash with your normal soap after using and rinsing off the CHG Soap.  9. Pat yourself dry with a CLEAN TOWEL.  10. Wear CLEAN PAJAMAS to bed the night before surgery  11. Place CLEAN SHEETS on your bed the night before your surgery  12. DO NOT SLEEP WITH PETS.   Day of Surgery: Shower with CHG soap. Do not wear jewelry, make up, or nail polish Do not wear lotions, powders, perfumes/colognes, or deodorant. Do not shave 48 hours prior to surgery.  Men may shave face and neck. Do not bring valuables to the hospital. [facility] is not responsible for any belongings or valuables. Wear  Clean/Comfortable clothing the morning of surgery Remember to brush your teeth WITH YOUR REGULAR TOOTHPASTE.   Please read over the following fact sheets that you were given.

## 2021-04-02 ENCOUNTER — Encounter (HOSPITAL_COMMUNITY): Payer: Self-pay

## 2021-04-02 ENCOUNTER — Encounter: Payer: Self-pay | Admitting: Nurse Practitioner

## 2021-04-02 ENCOUNTER — Encounter (HOSPITAL_COMMUNITY)
Admission: RE | Admit: 2021-04-02 | Discharge: 2021-04-02 | Disposition: A | Discharge status: Home / Self Care | Payer: Medicaid Other | Source: Ambulatory Visit | Attending: Otolaryngology | Admitting: Otolaryngology

## 2021-04-02 ENCOUNTER — Other Ambulatory Visit: Payer: Self-pay

## 2021-04-02 ENCOUNTER — Ambulatory Visit (INDEPENDENT_AMBULATORY_CARE_PROVIDER_SITE_OTHER): Payer: Medicaid Other | Admitting: Nurse Practitioner

## 2021-04-02 VITALS — BP 102/68 | HR 79 | Ht 65.0 in | Wt 272.4 lb

## 2021-04-02 DIAGNOSIS — Z20822 Contact with and (suspected) exposure to covid-19: Secondary | ICD-10-CM | POA: Insufficient documentation

## 2021-04-02 DIAGNOSIS — R140 Abdominal distension (gaseous): Secondary | ICD-10-CM | POA: Diagnosis not present

## 2021-04-02 DIAGNOSIS — R112 Nausea with vomiting, unspecified: Secondary | ICD-10-CM

## 2021-04-02 DIAGNOSIS — Z01818 Encounter for other preprocedural examination: Secondary | ICD-10-CM | POA: Diagnosis not present

## 2021-04-02 DIAGNOSIS — K219 Gastro-esophageal reflux disease without esophagitis: Secondary | ICD-10-CM | POA: Diagnosis not present

## 2021-04-02 DIAGNOSIS — K581 Irritable bowel syndrome with constipation: Secondary | ICD-10-CM | POA: Diagnosis not present

## 2021-04-02 HISTORY — DX: Gastro-esophageal reflux disease without esophagitis: K21.9

## 2021-04-02 LAB — CBC
HCT: 40.5 % (ref 36.0–46.0)
Hemoglobin: 12.5 g/dL (ref 12.0–15.0)
MCH: 24.5 pg — ABNORMAL LOW (ref 26.0–34.0)
MCHC: 30.9 g/dL (ref 30.0–36.0)
MCV: 79.4 fL — ABNORMAL LOW (ref 80.0–100.0)
Platelets: 318 10*3/uL (ref 150–400)
RBC: 5.1 MIL/uL (ref 3.87–5.11)
RDW: 13.5 % (ref 11.5–15.5)
WBC: 7.8 10*3/uL (ref 4.0–10.5)
nRBC: 0 % (ref 0.0–0.2)

## 2021-04-02 LAB — HEMOGLOBIN A1C
Hgb A1c MFr Bld: 5.6 % (ref 4.8–5.6)
Mean Plasma Glucose: 114.02 mg/dL

## 2021-04-02 LAB — BASIC METABOLIC PANEL
Anion gap: 11 (ref 5–15)
BUN: 8 mg/dL (ref 6–20)
CO2: 24 mmol/L (ref 22–32)
Calcium: 9.1 mg/dL (ref 8.9–10.3)
Chloride: 102 mmol/L (ref 98–111)
Creatinine, Ser: 0.76 mg/dL (ref 0.44–1.00)
GFR, Estimated: >60 mL/min (ref >60)
Glucose, Bld: 86 mg/dL (ref 70–99)
Potassium: 4.1 mmol/L (ref 3.5–5.1)
Sodium: 137 mmol/L (ref 135–145)

## 2021-04-02 LAB — GLUCOSE, CAPILLARY: Glucose-Capillary: 84 mg/dL (ref 70–99)

## 2021-04-02 MED ORDER — ONDANSETRON HCL 4 MG PO TABS: 4.0000 mg | ORAL_TABLET | Freq: Three times a day (TID) | ORAL | 2 refills | Status: AC | PRN

## 2021-04-02 NOTE — Patient Instructions (Signed)
We have sent the following medications to your pharmacy for you to pick up at your convenience:  Zofran.  Hold your Pantoprazole.  Continue twice a day Pepcid  You have been scheduled for an endoscopy. Please follow written instructions given to you at your visit today. If you use inhalers (even only as needed), please bring them with you on the day of your procedure.

## 2021-04-02 NOTE — Progress Notes (Signed)
PCP - Norva Riffle, PA Cardiologist - Denies  PPM/ICD - Denies  Chest x-ray - N/A EKG - 04/02/21 Stress Test -  Denies ECHO - Denies Cardiac Cath - Denies  Sleep Study - Denies    Fasting Blood Sugar 98 Patient does not check CBG at home. CBG at PAT appointment was 84. A1C obtained  Blood Thinner Instructions: N/A Aspirin Instructions: N/A  ERAS Protcol - N/A PRE-SURGERY Ensure or G2- N/A  COVID TEST- 04/02/21; Results pending   Anesthesia review: No  Patient denies shortness of breath, fever, cough and chest pain at PAT appointment   All instructions explained to the patient, with a verbal understanding of the material. Patient agrees to go over the instructions while at home for a better understanding. Patient also instructed to self quarantine after being tested for COVID-19. The opportunity to ask questions was provided.

## 2021-04-02 NOTE — Progress Notes (Signed)
ASSESSMENT AND PLAN    # 20 yo female with IBS with constipation, chronic bloating and now new nausea with occasional vomiting x 2 months --Bowels moving well on Metformin.  --tTg, IgA negative.  --Don't suspect pregnancy. Denies being sexually active, last menstrual cycle was a week ago --Will arrange for EGD for evaluation of nausea / vomiting. Unfortunately she is have a tonsillectomy on Friday and also in process of relocating so will not be available for EGD until late May. The risks and benefits of EGD with possible biopsies was discussed with the patient and they agree to proceed.  --If symptoms resolve while awaiting EGD then she will call to cancel. If bloating persists and EGD negative ( or if she doesn't get the EGD) then consider SIBO testing.  --Trial of Zofran 4 mg Q hours prn   # GERD with globus. Symptoms started a year ago and controlled with Pepcid. She was originally taking Pepcid once a day but says at some point it was increased it to BID ( she is unsure why). Furthermore, at some point she was started on Protonix but can't remember why.  --Since she is asymptomatic will hold Protonix and stay on BID pepcid for now.  Goal is for her to take the lowest effective dose of reflux medication --GERD literature given   HISTORY OF PRESENT ILLNESS    Chief Complaint :bloaitng, nausuea / vomiting  Sara Cervantes is a 20 y.o. female known to Dr. Silverio Decamp with a past medical history of obesity,IBS with constipation, migraines, depression / anxiety, cholecystectomy, pre-diabetes, PCOS  Patient was last seen here September 2021. She had bloating, abdominal cramping. Celiac studies, inflammatory studies were negative. CT scan was negative. We gave her Robinul which she still takes twice daily.  She was noted to have IDA, not attributable to menses. She underwent a complete colonoscopy which was unremarkable.  INTERVAL HISTORY: Patient complains of ongoing of ongoing  bloating / upper abdominal distention. The bloating is mainly before meals and it does get better with defecation. She doesn't have much flatus or belching. She doesn't use artificial sweeteners.  Her bowels have been moving well since metformin dose was increased . Over the last two months she had nausea with intermittent vomiting unrelated to the bloating.  Symptoms not related to eating either. She doesn't use THC. No recent medication changes. No blood in her stool. No significant NSAID use. No chance of pregnancy. She is not sexually active. Last menses was last week.  Past Medical History:  Diagnosis Date  . Allergy    Phreesia 07/07/2020  . Anemia   . Anxiety    Phreesia 07/07/2020  . Asthma    Phreesia 07/07/2020  . Cardiac arrhythmia   . Depression with anxiety   . Diabetes mellitus without complication ([place])   . Exercise-induced asthma   . Hearing deficit, bilateral   . IBS (irritable bowel syndrome)   . Migraines   . Morbid obesity ([place])   . Prediabetes   . S/P laparoscopic cholecystectomy   . Seasonal allergies     Current Medications, Allergies, Past Surgical History, Family History and Social History were reviewed in [employer] record.   Current Outpatient Medications  Medication Sig Dispense Refill  . Accu-Chek Softclix Lancets lancets Use to check blood sugar once daily. 100 each 2  . albuterol (VENTOLIN HFA) 108 (90 Base) MCG/ACT inhaler Inhale 2 puffs into the lungs every 6 (six) hours as needed for  wheezing or shortness of breath. 18 g 1  . Blood Glucose Monitoring Suppl (ACCU-CHEK GUIDE ME) w/Device KIT Use to check blood sugar once daily. 1 kit 0  . busPIRone (BUSPAR) 5 MG tablet Take 1 tablet (5 mg total) by mouth 2 (two) times daily. 180 tablet 1  . eletriptan (RELPAX) 20 MG tablet Take 20 mg by mouth 2 (two) times daily as needed for migraine.    . famotidine (PEPCID) 20 MG tablet Take 20 mg by mouth 2 (two) times daily.    .  fluticasone (FLONASE) 50 MCG/ACT nasal spray Place 2 sprays into both nostrils daily as needed for allergies.    . fluticasone (FLOVENT HFA) 44 MCG/ACT inhaler Inhale 2 puffs into the lungs in the morning and at bedtime. 1 each 12  . glucose blood (ACCU-CHEK GUIDE) test strip Use to check blood sugar once daily. 100 each 2  . hydrOXYzine (ATARAX/VISTARIL) 10 MG tablet Take 10 mg by mouth at bedtime.     . metFORMIN (GLUCOPHAGE) 850 MG tablet TAKE 1 TABLET (850 MG TOTAL) BY MOUTH 2 (TWO) TIMES DAILY WITH A MEAL. 180 tablet 0  . montelukast (SINGULAIR) 10 MG tablet Take 1 tablet (10 mg total) by mouth at bedtime. 30 tablet 3  . pantoprazole (PROTONIX) 40 MG tablet Take 1 tablet (40 mg total) by mouth daily. 30 tablet 3  . Pediatric Multivitamins-Iron (FLINTSTONES PLUS IRON PO) Take 2 tablets by mouth daily.    . prazosin (MINIPRESS) 1 MG capsule Take 1 mg by mouth at bedtime.     . sertraline (ZOLOFT) 100 MG tablet Take 100 mg by mouth at bedtime. Take with 50 mg to equal 150 mg at bedtime    . sertraline (ZOLOFT) 50 MG tablet Take 3 tablets (150 mg total) by mouth daily. 270 tablet 1  . Spacer/Aero-Holding Chambers (AEROCHAMBER MV) inhaler Use as instructed 1 each 0  . traZODone (DESYREL) 50 MG tablet Take 0.5-1 tablets (25-50 mg total) by mouth at bedtime as needed for sleep. (Patient taking differently: Take 25 mg by mouth at bedtime.) 90 tablet 1   No current facility-administered medications for this visit.    Review of Systems: No chest pain. No shortness of breath. No urinary complaints.   PHYSICAL EXAM :    Wt Readings from Last 3 Encounters:  04/02/21 272 lb 6.4 oz (123.6 kg)  03/03/21 279 lb (126.6 kg)  02/04/21 295 lb (133.8 kg)    BP 102/68   Pulse 79   Ht 5' 5" (1.651 m)   Wt 272 lb 6.4 oz (123.6 kg)   SpO2 97%   BMI 45.33 kg/m  Constitutional:  Pleasant female in no acute distress. Psychiatric: Normal mood and affect. Behavior is normal. EENT: Pupils normal.   Conjunctivae are normal. No scleral icterus. Neck supple.  Cardiovascular: Normal rate, regular rhythm. No edema Pulmonary/chest: Effort normal and breath sounds normal. No wheezing, rales or rhonchi. Abdominal: Soft, nondistended, nontender. Bowel sounds active throughout. There are no masses palpable. No hepatomegaly. Neurological: Alert and oriented to person place and time. Skin: Skin is warm and dry. No rashes noted.  Tye Savoy, NP  04/02/2021, 10:43 AM

## 2021-04-03 LAB — SARS CORONAVIRUS 2 (TAT 6-24 HRS): SARS Coronavirus 2: NEGATIVE

## 2021-04-04 ENCOUNTER — Ambulatory Visit (HOSPITAL_COMMUNITY): Payer: Medicaid Other | Admitting: Anesthesiology

## 2021-04-04 ENCOUNTER — Encounter (HOSPITAL_COMMUNITY)
Admission: RE | Disposition: A | Discharge status: Home / Self Care | Payer: Self-pay | Source: Home / Self Care | Attending: Otolaryngology

## 2021-04-04 ENCOUNTER — Other Ambulatory Visit: Payer: Self-pay

## 2021-04-04 ENCOUNTER — Observation Stay (HOSPITAL_COMMUNITY)
Admission: RE | Admit: 2021-04-04 | Discharge: 2021-04-05 | Disposition: A | Discharge status: Home / Self Care | Payer: Medicaid Other | Attending: Otolaryngology | Admitting: Otolaryngology

## 2021-04-04 ENCOUNTER — Other Ambulatory Visit: Payer: Self-pay | Admitting: Internal Medicine

## 2021-04-04 ENCOUNTER — Encounter (HOSPITAL_COMMUNITY): Payer: Self-pay | Admitting: Otolaryngology

## 2021-04-04 DIAGNOSIS — G4700 Insomnia, unspecified: Secondary | ICD-10-CM

## 2021-04-04 DIAGNOSIS — J3501 Chronic tonsillitis: Secondary | ICD-10-CM | POA: Diagnosis present

## 2021-04-04 DIAGNOSIS — J45909 Unspecified asthma, uncomplicated: Secondary | ICD-10-CM | POA: Diagnosis not present

## 2021-04-04 DIAGNOSIS — R7303 Prediabetes: Secondary | ICD-10-CM | POA: Diagnosis not present

## 2021-04-04 DIAGNOSIS — Z9089 Acquired absence of other organs: Secondary | ICD-10-CM

## 2021-04-04 HISTORY — PX: TONSILLECTOMY: SHX5217

## 2021-04-04 LAB — GLUCOSE, CAPILLARY
Glucose-Capillary: 99 mg/dL (ref 70–99)
Glucose-Capillary: 113 mg/dL — ABNORMAL HIGH (ref 70–99)
Glucose-Capillary: 107 mg/dL — ABNORMAL HIGH (ref 70–99)
Glucose-Capillary: 123 mg/dL — ABNORMAL HIGH (ref 70–99)
Glucose-Capillary: 123 mg/dL — ABNORMAL HIGH (ref 70–99)

## 2021-04-04 LAB — POCT PREGNANCY, URINE: Preg Test, Ur: NEGATIVE

## 2021-04-04 SURGERY — TONSILLECTOMY
Anesthesia: General | Site: Throat | Laterality: Bilateral

## 2021-04-04 MED ORDER — LIDOCAINE 2% (20 MG/ML) 5 ML SYRINGE
INTRAMUSCULAR | Status: DC | PRN
  Administered 2021-04-04: 60 mg via INTRAVENOUS

## 2021-04-04 MED ORDER — LIDOCAINE 2% (20 MG/ML) 5 ML SYRINGE
INTRAMUSCULAR | Status: AC
  Filled 2021-04-04: qty 10

## 2021-04-04 MED ORDER — LACTATED RINGERS IV SOLN: INTRAVENOUS | Status: DC | PRN

## 2021-04-04 MED ORDER — MIDAZOLAM HCL 5 MG/5ML IJ SOLN
INTRAMUSCULAR | Status: DC | PRN
  Administered 2021-04-04: 1 mg via INTRAVENOUS

## 2021-04-04 MED ORDER — PROPOFOL 10 MG/ML IV BOLUS
INTRAVENOUS | Status: AC
  Filled 2021-04-04: qty 20

## 2021-04-04 MED ORDER — FENTANYL CITRATE (PF) 100 MCG/2ML IJ SOLN
INTRAMUSCULAR | Status: AC
  Filled 2021-04-04: qty 2

## 2021-04-04 MED ORDER — ALBUTEROL SULFATE HFA 108 (90 BASE) MCG/ACT IN AERS
2.0000 | INHALATION_SPRAY | Freq: Four times a day (QID) | RESPIRATORY_TRACT | Status: DC | PRN
  Filled 2021-04-04: qty 6.7

## 2021-04-04 MED ORDER — SUGAMMADEX SODIUM 200 MG/2ML IV SOLN
INTRAVENOUS | Status: DC | PRN
  Administered 2021-04-04: 400 mg via INTRAVENOUS

## 2021-04-04 MED ORDER — FENTANYL CITRATE (PF) 250 MCG/5ML IJ SOLN
INTRAMUSCULAR | Status: AC
  Filled 2021-04-04: qty 5

## 2021-04-04 MED ORDER — PANTOPRAZOLE SODIUM 40 MG PO TBEC
40.0000 mg | DELAYED_RELEASE_TABLET | Freq: Every day | ORAL | Status: DC
  Administered 2021-04-04 – 2021-04-05 (×2): 40 mg via ORAL
  Filled 2021-04-04 (×2): qty 1

## 2021-04-04 MED ORDER — HYDROXYZINE HCL 10 MG PO TABS
10.0000 mg | ORAL_TABLET | Freq: Every day | ORAL | Status: DC
  Administered 2021-04-04: 10 mg via ORAL
  Filled 2021-04-04 (×2): qty 1

## 2021-04-04 MED ORDER — METFORMIN HCL 850 MG PO TABS
850.0000 mg | ORAL_TABLET | Freq: Two times a day (BID) | ORAL | Status: DC
  Administered 2021-04-04 – 2021-04-05 (×2): 850 mg via ORAL
  Filled 2021-04-04 (×3): qty 1

## 2021-04-04 MED ORDER — ONDANSETRON HCL 4 MG PO TABS: 4.0000 mg | ORAL_TABLET | Freq: Three times a day (TID) | ORAL | Status: DC | PRN

## 2021-04-04 MED ORDER — DEXAMETHASONE SODIUM PHOSPHATE 10 MG/ML IJ SOLN
INTRAMUSCULAR | Status: DC | PRN
  Administered 2021-04-04: 10 mg via INTRAVENOUS

## 2021-04-04 MED ORDER — ORAL CARE MOUTH RINSE: 15.0000 mL | Freq: Once | OROMUCOSAL | Status: AC

## 2021-04-04 MED ORDER — FAMOTIDINE 20 MG PO TABS
20.0000 mg | ORAL_TABLET | Freq: Two times a day (BID) | ORAL | Status: DC
  Administered 2021-04-04 – 2021-04-05 (×2): 20 mg via ORAL
  Filled 2021-04-04 (×2): qty 1

## 2021-04-04 MED ORDER — FENTANYL CITRATE (PF) 250 MCG/5ML IJ SOLN
INTRAMUSCULAR | Status: DC | PRN
  Administered 2021-04-04 (×3): 50 ug via INTRAVENOUS

## 2021-04-04 MED ORDER — PROPOFOL 10 MG/ML IV BOLUS
INTRAVENOUS | Status: DC | PRN
  Administered 2021-04-04: 200 mg via INTRAVENOUS

## 2021-04-04 MED ORDER — ROCURONIUM BROMIDE 10 MG/ML (PF) SYRINGE
PREFILLED_SYRINGE | INTRAVENOUS | Status: DC | PRN
  Administered 2021-04-04: 60 mg via INTRAVENOUS

## 2021-04-04 MED ORDER — BUDESONIDE 0.25 MG/2ML IN SUSP
0.2500 mg | Freq: Two times a day (BID) | RESPIRATORY_TRACT | Status: DC
  Administered 2021-04-04: 0.25 mg via RESPIRATORY_TRACT
  Filled 2021-04-04 (×4): qty 2

## 2021-04-04 MED ORDER — ONDANSETRON HCL 4 MG/2ML IJ SOLN
4.0000 mg | Freq: Once | INTRAMUSCULAR | Status: AC | PRN
  Administered 2021-04-04: 4 mg via INTRAVENOUS

## 2021-04-04 MED ORDER — PHENOL 1.4 % MT LIQD: 1.0000 | OROMUCOSAL | Status: DC | PRN

## 2021-04-04 MED ORDER — SERTRALINE HCL 50 MG PO TABS
150.0000 mg | ORAL_TABLET | Freq: Every day | ORAL | Status: DC
  Administered 2021-04-04: 150 mg via ORAL
  Filled 2021-04-04: qty 3

## 2021-04-04 MED ORDER — ROCURONIUM BROMIDE 10 MG/ML (PF) SYRINGE
PREFILLED_SYRINGE | INTRAVENOUS | Status: AC
  Filled 2021-04-04: qty 20

## 2021-04-04 MED ORDER — HYDROXYZINE HCL 50 MG/ML IM SOLN
50.0000 mg | Freq: Four times a day (QID) | INTRAMUSCULAR | Status: DC | PRN
  Administered 2021-04-04: 50 mg via INTRAMUSCULAR
  Filled 2021-04-04: qty 1

## 2021-04-04 MED ORDER — FENTANYL CITRATE (PF) 100 MCG/2ML IJ SOLN
25.0000 ug | INTRAMUSCULAR | Status: DC | PRN
  Administered 2021-04-04: 50 ug via INTRAVENOUS

## 2021-04-04 MED ORDER — INSULIN ASPART 100 UNIT/ML IJ SOLN: 0.0000 [IU] | Freq: Three times a day (TID) | INTRAMUSCULAR | Status: DC

## 2021-04-04 MED ORDER — PHENYLEPHRINE 40 MCG/ML (10ML) SYRINGE FOR IV PUSH (FOR BLOOD PRESSURE SUPPORT)
PREFILLED_SYRINGE | INTRAVENOUS | Status: AC
  Filled 2021-04-04: qty 10

## 2021-04-04 MED ORDER — LACTATED RINGERS IV SOLN: INTRAVENOUS | Status: DC

## 2021-04-04 MED ORDER — HYDROCODONE-ACETAMINOPHEN 7.5-325 MG/15ML PO SOLN
10.0000 mL | ORAL | Status: DC | PRN
  Administered 2021-04-04 – 2021-04-05 (×5): 15 mL via ORAL
  Filled 2021-04-04 (×5): qty 15

## 2021-04-04 MED ORDER — ONDANSETRON HCL 4 MG/2ML IJ SOLN
INTRAMUSCULAR | Status: AC
  Filled 2021-04-04: qty 2

## 2021-04-04 MED ORDER — DEXAMETHASONE SODIUM PHOSPHATE 10 MG/ML IJ SOLN
INTRAMUSCULAR | Status: AC
  Filled 2021-04-04: qty 2

## 2021-04-04 MED ORDER — ONDANSETRON HCL 4 MG/2ML IJ SOLN
INTRAMUSCULAR | Status: DC | PRN
  Administered 2021-04-04: 4 mg via INTRAVENOUS

## 2021-04-04 MED ORDER — ELETRIPTAN HYDROBROMIDE 20 MG PO TABS
20.0000 mg | ORAL_TABLET | Freq: Two times a day (BID) | ORAL | Status: DC | PRN
  Filled 2021-04-04: qty 1

## 2021-04-04 MED ORDER — ONDANSETRON HCL 4 MG/2ML IJ SOLN
INTRAMUSCULAR | Status: AC
  Filled 2021-04-04: qty 4

## 2021-04-04 MED ORDER — 0.9 % SODIUM CHLORIDE (POUR BTL) OPTIME
TOPICAL | Status: DC | PRN
  Administered 2021-04-04: 1000 mL

## 2021-04-04 MED ORDER — BUSPIRONE HCL 5 MG PO TABS
5.0000 mg | ORAL_TABLET | Freq: Two times a day (BID) | ORAL | Status: DC
  Administered 2021-04-04 – 2021-04-05 (×2): 5 mg via ORAL
  Filled 2021-04-04 (×3): qty 1

## 2021-04-04 MED ORDER — MONTELUKAST SODIUM 10 MG PO TABS
10.0000 mg | ORAL_TABLET | Freq: Every day | ORAL | Status: DC
  Administered 2021-04-04: 10 mg via ORAL
  Filled 2021-04-04: qty 1

## 2021-04-04 MED ORDER — TRAZODONE 25 MG HALF TABLET
25.0000 mg | ORAL_TABLET | Freq: Every day | ORAL | Status: DC
  Administered 2021-04-04: 25 mg via ORAL
  Filled 2021-04-04 (×2): qty 1

## 2021-04-04 MED ORDER — INSULIN ASPART 100 UNIT/ML IJ SOLN
0.0000 [IU] | INTRAMUSCULAR | Status: DC
  Administered 2021-04-04: 2 [IU] via SUBCUTANEOUS

## 2021-04-04 MED ORDER — MIDAZOLAM HCL 2 MG/2ML IJ SOLN
INTRAMUSCULAR | Status: AC
  Filled 2021-04-04: qty 2

## 2021-04-04 MED ORDER — CHLORHEXIDINE GLUCONATE 0.12 % MT SOLN
15.0000 mL | Freq: Once | OROMUCOSAL | Status: AC
  Administered 2021-04-04: 15 mL via OROMUCOSAL
  Filled 2021-04-04: qty 15

## 2021-04-04 MED ORDER — POTASSIUM CHLORIDE 2 MEQ/ML IV SOLN
INTRAVENOUS | Status: DC
  Filled 2021-04-04: qty 1000

## 2021-04-04 MED ORDER — IBUPROFEN 100 MG/5ML PO SUSP
400.0000 mg | Freq: Four times a day (QID) | ORAL | Status: DC | PRN
  Filled 2021-04-04: qty 20

## 2021-04-04 SURGICAL SUPPLY — 24 items
CANISTER SUCT 3000ML PPV (MISCELLANEOUS) ×2 IMPLANT
CATH ROBINSON RED A/P 10FR (CATHETERS) ×2 IMPLANT
CLEANER TIP ELECTROSURG 2X2 (MISCELLANEOUS) ×2 IMPLANT
COAGULATOR SUCT SWTCH 10FR 6 (ELECTROSURGICAL) ×2 IMPLANT
DRAPE HALF SHEET 40X57 (DRAPES) ×2 IMPLANT
ELECT COATED BLADE 2.86 ST (ELECTRODE) ×2 IMPLANT
ELECT REM PT RETURN 9FT ADLT (ELECTROSURGICAL) ×2
ELECTRODE REM PT RTRN 9FT ADLT (ELECTROSURGICAL) ×1 IMPLANT
GAUZE 4X4 16PLY RFD (DISPOSABLE) ×2 IMPLANT
GLOVE ECLIPSE 7.5 STRL STRAW (GLOVE) ×2 IMPLANT
GOWN STRL REUS W/ TWL LRG LVL3 (GOWN DISPOSABLE) ×2 IMPLANT
GOWN STRL REUS W/TWL LRG LVL3 (GOWN DISPOSABLE) ×2
KIT BASIN OR (CUSTOM PROCEDURE TRAY) ×2 IMPLANT
KIT TURNOVER KIT B (KITS) ×2 IMPLANT
NS IRRIG 1000ML POUR BTL (IV SOLUTION) ×2 IMPLANT
PACK SURGICAL SETUP 50X90 (CUSTOM PROCEDURE TRAY) ×2 IMPLANT
PAD ARMBOARD 7.5X6 YLW CONV (MISCELLANEOUS) ×4 IMPLANT
PENCIL FOOT CONTROL (ELECTRODE) ×2 IMPLANT
SPONGE TONSIL TAPE 1 RFD (DISPOSABLE) ×2 IMPLANT
SYR BULB EAR ULCER 3OZ GRN STR (SYRINGE) ×2 IMPLANT
TOWEL GREEN STERILE FF (TOWEL DISPOSABLE) ×4 IMPLANT
TUBE CONNECTING 12X1/4 (SUCTIONS) ×2 IMPLANT
TUBE SALEM SUMP 16 FR W/ARV (TUBING) ×2 IMPLANT
WATER STERILE IRR 1000ML POUR (IV SOLUTION) ×2 IMPLANT

## 2021-04-04 NOTE — Progress Notes (Signed)
Postop check, still very sleepy, also having nausea.  She is easily arousable.  She has been drinking well.  She would like some soft food instead of the liquid diet.  Her voice sounds clear.  There is no bleeding.  Stable postop.  Continue overnight care with IV support and treatment of pain and nausea.  Will advance to soft diet.  Anticipate discharge in the morning.

## 2021-04-04 NOTE — Transfer of Care (Signed)
Immediate Anesthesia Transfer of Care Note  Patient: Sara Cervantes  Procedure(s) Performed: TONSILLECTOMY (Bilateral Throat)  Patient Location: PACU  Anesthesia Type:General  Level of Consciousness: awake, drowsy and patient cooperative  Airway & Oxygen Therapy: Patient Spontanous Breathing and Patient connected to face mask oxygen  Post-op Assessment: Report given to RN, Post -op Vital signs reviewed and stable and Patient moving all extremities X 4  Post vital signs: Reviewed and stable  Last Vitals:  Vitals Value Taken Time  BP 134/71 04/04/21 1055  Temp    Pulse 79 04/04/21 1056  Resp 19 04/04/21 1056  SpO2 95 % 04/04/21 1056  Vitals shown include unvalidated device data.  Last Pain:  Vitals:   04/04/21 0848  TempSrc:   PainSc: 0-No pain      Patients Stated Pain Goal: 3 (04/04/21 0848)  Complications: No complications documented.

## 2021-04-04 NOTE — Op Note (Signed)
04/04/2021  10:38 AM  PATIENT:  Sara Cervantes  20 y.o. female  PRE-OPERATIVE DIAGNOSIS:  Chronic Tonsillitis  POST-OPERATIVE DIAGNOSIS:  Chronic Tonsillitis  PROCEDURE:  Procedure(s): TONSILLECTOMY  SURGEON:  Surgeon(s): Serena Colonel, MD  ANESTHESIA:   General  COUNTS: Correct  EBL: 50cc   DICTATION: The patient was taken to the operating room and placed on the operating table in the supine position. Following induction of general endotracheal anesthesia, the table was turned and the patient was draped in a standard fashion. A Crowe-Davis mouthgag was inserted into the oral cavity and used to retract the tongue and mandible, then attached to the Mayo stand.  The tonsillectomy was then performed using electrocautery dissection, carefully dissecting the avascular plane between the capsule and constrictor muscles. Cautery was used for completion of hemostasis. The tonsils were large , and were discarded.  The pharynx was irrigated with saline and suctioned. An oral gastric tube was used to aspirate the contents of the stomach. The patient was then awakened from anesthesia and transferred to PACU in stable condition.   PATIENT DISPOSITION:  To PACA, stable

## 2021-04-04 NOTE — Anesthesia Preprocedure Evaluation (Addendum)
Anesthesia Evaluation  Patient identified by MRN, date of birth, ID band Patient awake    Reviewed: Allergy & Precautions, NPO status , Patient's Chart, lab work & pertinent test results  Airway Mallampati: III  TM Distance: >3 FB Neck ROM: Full    Dental  (+) Teeth Intact, Dental Advisory Given   Pulmonary    breath sounds clear to auscultation       Cardiovascular  Rhythm:Regular Rate:Normal     Neuro/Psych    GI/Hepatic   Endo/Other  diabetes  Renal/GU      Musculoskeletal   Abdominal (+) + obese,   Peds  Hematology   Anesthesia Other Findings   Reproductive/Obstetrics                            Anesthesia Physical Anesthesia Plan  ASA: II  Anesthesia Plan: General   Post-op Pain Management:    Induction: Intravenous  PONV Risk Score and Plan: Ondansetron and Dexamethasone  Airway Management Planned: Oral ETT  Additional Equipment:   Intra-op Plan:   Post-operative Plan: Extubation in OR  Informed Consent: I have reviewed the patients History and Physical, chart, labs and discussed the procedure including the risks, benefits and alternatives for the proposed anesthesia with the patient or authorized representative who has indicated his/her understanding and acceptance.     Dental advisory given  Plan Discussed with: CRNA and Anesthesiologist  Anesthesia Plan Comments:         Anesthesia Quick Evaluation

## 2021-04-04 NOTE — Progress Notes (Signed)
Reviewed and agree with documentation and assessment and plan. K. Veena Margarie Mcguirt , MD   

## 2021-04-04 NOTE — Anesthesia Procedure Notes (Signed)
Procedure Name: Intubation Date/Time: 04/04/2021 10:12 AM Performed by: Lanell Matar, CRNA Pre-anesthesia Checklist: Patient identified, Emergency Drugs available, Suction available and Patient being monitored Patient Re-evaluated:Patient Re-evaluated prior to induction Oxygen Delivery Method: Circle System Utilized Preoxygenation: Pre-oxygenation with 100% oxygen Induction Type: IV induction Ventilation: Mask ventilation without difficulty and Oral airway inserted - appropriate to patient size Laryngoscope Size: Hyacinth Meeker and 2 Grade View: Grade I Tube type: Oral Tube size: 7.0 mm Number of attempts: 1 Airway Equipment and Method: Stylet and Oral airway Placement Confirmation: ETT inserted through vocal cords under direct vision,  positive ETCO2 and breath sounds checked- equal and bilateral Secured at: 20 cm Tube secured with: Tape Dental Injury: Teeth and Oropharynx as per pre-operative assessment

## 2021-04-04 NOTE — Interval H&P Note (Signed)
History and Physical Interval Note:  04/04/2021 9:36 AM  [name]  has presented today for surgery, with the diagnosis of Chronic Tonsillitis.  The various methods of treatment have been discussed with the patient and family. After consideration of risks, benefits and other options for treatment, the patient has consented to  Procedure(s): TONSILLECTOMY (Bilateral) as a surgical intervention.  The patient's history has been reviewed, patient examined, no change in status, stable for surgery.  I have reviewed the patient's chart and labs.  Questions were answered to the patient's satisfaction.     Sara Cervantes

## 2021-04-04 NOTE — Anesthesia Postprocedure Evaluation (Signed)
Anesthesia Post Note  Patient: Sara Cervantes  Procedure(s) Performed: TONSILLECTOMY (Bilateral Throat)     Patient location during evaluation: PACU Anesthesia Type: General Level of consciousness: awake and alert Pain management: pain level controlled Vital Signs Assessment: post-procedure vital signs reviewed and stable Respiratory status: spontaneous breathing, nonlabored ventilation, respiratory function stable and patient connected to nasal cannula oxygen Cardiovascular status: blood pressure returned to baseline and stable Postop Assessment: no apparent nausea or vomiting Anesthetic complications: no   No complications documented.  Last Vitals:  Vitals:   04/04/21 1301 04/04/21 1609  BP: 123/81 122/72  Pulse: 71 89  Resp: 18 18  Temp: 36.7 C 36.6 C  SpO2: 95% 97%    Last Pain:  Vitals:   04/04/21 1609  TempSrc: Oral  PainSc:                  Jontae Adebayo COKER

## 2021-04-05 ENCOUNTER — Encounter (HOSPITAL_COMMUNITY): Payer: Self-pay | Admitting: Otolaryngology

## 2021-04-05 DIAGNOSIS — J3501 Chronic tonsillitis: Secondary | ICD-10-CM | POA: Diagnosis not present

## 2021-04-05 LAB — GLUCOSE, CAPILLARY: Glucose-Capillary: 118 mg/dL — ABNORMAL HIGH (ref 70–99)

## 2021-04-05 NOTE — Progress Notes (Signed)
Patient is discharged from room 3C10 at this time. Alert and in stable condition. IV site d/c'd and instructions read to patient and mother with understanding verbalized and all questions answered. Left unit via wheelchair with all belongings at side. 

## 2021-04-05 NOTE — Discharge Summary (Signed)
Physician Discharge Summary  Patient ID: Sara Cervantes MRN: [id number] DOB/AGE: 27-Aug-2001 20 y.o.  Admit date: 04/04/2021 Discharge date: 04/05/2021  Admission Diagnoses: Chronic tonsillitis  Discharge Diagnoses:  Chronic tonsillitis Active Problems:   S/P tonsillectomy   Discharged Condition: good  Hospital Course: 20 year old female with chronic tonsillitis presented for tonsillectomy.  See operative note.  She was observed overnight with no problems.  She is stable for discharge home on POD 1.  Consults: None  Significant Diagnostic Studies: None  Treatments: surgery: Tonsillectomy  Discharge Exam: Blood pressure (!) 99/57, pulse 78, temperature 98.1 F (36.7 C), temperature source Oral, resp. rate 18, height '5\' 5"'  (1.651 m), weight 123.4 kg, last menstrual period 03/23/2021, SpO2 96 %. General appearance: alert, cooperative and no distress Throat: no bleeding  Disposition: Discharge disposition: 01-Home or Self Care       Discharge Instructions    Diet - low sodium heart healthy   Complete by: As directed    Discharge instructions   Complete by: As directed    Drink plenty of fluids.  Use prescription pain medicine regularly, can supplement with ibuprofen.  Call with bleeding, inability to drink fluids, or high fevers.   Increase activity slowly   Complete by: As directed    No wound care   Complete by: As directed      Allergies as of 04/05/2021      Reactions   Penicillins Anaphylaxis, Hives      Medication List    TAKE these medications   Accu-Chek Guide Me w/Device Kit Use to check blood sugar once daily.   Accu-Chek Guide test strip Generic drug: glucose blood Use to check blood sugar once daily.   Accu-Chek Softclix Lancets lancets Use to check blood sugar once daily.   AeroChamber MV inhaler Use as instructed   albuterol 108 (90 Base) MCG/ACT inhaler Commonly known as: VENTOLIN HFA Inhale 2 puffs into the lungs every 6 (six)  hours as needed for wheezing or shortness of breath.   busPIRone 5 MG tablet Commonly known as: BUSPAR Take 1 tablet (5 mg total) by mouth 2 (two) times daily.   eletriptan 20 MG tablet Commonly known as: RELPAX Take 20 mg by mouth 2 (two) times daily as needed for migraine.   famotidine 20 MG tablet Commonly known as: PEPCID Take 20 mg by mouth 2 (two) times daily.   FLINTSTONES PLUS IRON PO Take 2 tablets by mouth daily.   Flovent HFA 44 MCG/ACT inhaler Generic drug: fluticasone Inhale 2 puffs into the lungs in the morning and at bedtime.   fluticasone 50 MCG/ACT nasal spray Commonly known as: FLONASE Place 2 sprays into both nostrils daily as needed for allergies.   hydrOXYzine 10 MG tablet Commonly known as: ATARAX/VISTARIL Take 10 mg by mouth at bedtime.   metFORMIN 850 MG tablet Commonly known as: GLUCOPHAGE TAKE 1 TABLET (850 MG TOTAL) BY MOUTH 2 (TWO) TIMES DAILY WITH A MEAL.   montelukast 10 MG tablet Commonly known as: SINGULAIR Take 1 tablet (10 mg total) by mouth at bedtime.   ondansetron 4 MG tablet Commonly known as: ZOFRAN Take 1 tablet (4 mg total) by mouth every 8 (eight) hours as needed for nausea or vomiting.   pantoprazole 40 MG tablet Commonly known as: PROTONIX Take 1 tablet (40 mg total) by mouth daily.   prazosin 1 MG capsule Commonly known as: MINIPRESS Take 1 mg by mouth at bedtime.   sertraline 50 MG tablet Commonly known  as: ZOLOFT Take 3 tablets (150 mg total) by mouth daily.   sertraline 100 MG tablet Commonly known as: ZOLOFT Take 100 mg by mouth at bedtime. Take with 50 mg to equal 150 mg at bedtime   traZODone 50 MG tablet Commonly known as: DESYREL Take 0.5-1 tablets (25-50 mg total) by mouth at bedtime as needed for sleep. What changed:   how much to take  when to take this       Follow-up Information    Izora Gala, MD. Schedule an appointment as soon as possible for a visit in 3 week(s).   Specialty:  Otolaryngology Contact information: [address] [place] [address] [phone]               Signed: Melida Quitter 04/05/2021, 12:02 PM

## 2021-04-07 ENCOUNTER — Telehealth: Payer: Self-pay

## 2021-04-07 NOTE — Telephone Encounter (Signed)
Transition Care Management Follow-up Telephone Call  Date of discharge and from where: 04/05/2021 from [facility]   How have you been since you were released from the hospital? Pt stated that her symptoms have improved. Pt has no questions or concerns at this time.   Any questions or concerns? No  Items Reviewed:  Did the pt receive and understand the discharge instructions provided? Yes   Medications obtained and verified? Yes   Other? No   Any new allergies since your discharge? No   Dietary orders reviewed? n/a  Do you have support at home? Yes   Functional Questionnaire: (I = Independent and D = Dependent) ADLs: I  Bathing/Dressing- I  Meal Prep- I  Eating- I  Maintaining continence- I  Transferring/Ambulation- I  Managing Meds- I   Follow up appointments reviewed:   PCP Hospital f/u appt confirmed? Yes  Scheduled to see Lissa Hoard, MD on 05/01/2021  Specialist Hospital f/u appt confirmed? No    Are transportation arrangements needed? No   If their condition worsens, is the pt aware to call PCP or go to the Emergency Dept.? Yes  Was the patient provided with contact information for the PCP's office or ED? Yes  Was to pt encouraged to call back with questions or concerns? Yes

## 2021-04-28 ENCOUNTER — Other Ambulatory Visit: Payer: Self-pay | Admitting: Internal Medicine

## 2021-04-28 DIAGNOSIS — R7303 Prediabetes: Secondary | ICD-10-CM

## 2021-05-01 ENCOUNTER — Ambulatory Visit: Payer: Medicaid Other | Attending: Critical Care Medicine | Admitting: Nurse Practitioner

## 2021-05-01 ENCOUNTER — Other Ambulatory Visit: Payer: Self-pay

## 2021-05-01 VITALS — Ht 65.0 in | Wt 260.0 lb

## 2021-05-01 DIAGNOSIS — J4520 Mild intermittent asthma, uncomplicated: Secondary | ICD-10-CM | POA: Diagnosis not present

## 2021-05-01 DIAGNOSIS — Z3A01 Less than 8 weeks gestation of pregnancy: Secondary | ICD-10-CM | POA: Diagnosis not present

## 2021-05-01 NOTE — Patient Instructions (Addendum)
Mild intermittent asthma without complication:  Stable.  [redacted] weeks gestation:  Please follow with OB as scheduled  Continue medications per OB  Follow up:  May follow up in 6 months with PCP

## 2021-05-01 NOTE — Progress Notes (Signed)
Virtual Visit via Telephone Note  I connected with Sara Cervantes on 05/07/21 at 10:00 AM EDT by telephone and verified that I am speaking with the correct person using two identifiers.  Location: Patient: home Provider: office   I discussed the limitations, risks, security and privacy concerns of performing an evaluation and management service by telephone and the availability of in person appointments. I also discussed with the patient that there may be a patient responsible charge related to this service. The patient expressed understanding and agreed to proceed.   History of Present Illness:  Patient presents today for follow-up visit.  This is a patient of Dr. Delford Field.  States she last saw Dr. Delford Field in March she has had a tonsillectomy.  She states that the procedure went well.  She is also currently [redacted] weeks pregnant.  She does have her first OB visit tomorrow.  Has needed to change medications due to pregnancy currently taking zoloft, metformin, and pepcid and prenatals per OB/GYN recommendations. She states that she is currently living in [place]. She attends [employer] - getting kinesiology degree plans to do PT. Denies f/c/s, n/v/d, hemoptysis, PND, chest pain or edema.    Observations/Objective:  Vitals with BMI 05/01/2021 04/05/2021 04/05/2021  Height 5\' 5"  - -  Weight 260 lbs - -  BMI 43.27 - -  Systolic - 99 110  Diastolic - 57 66  Pulse - 78 84      Assessment and Plan:      I discussed the assessment and treatment plan with the patient. The patient was provided an opportunity to ask questions and all were answered. The patient agreed with the plan and demonstrated an understanding of the instructions.   The patient was advised to call back or seek an in-person evaluation if the symptoms worsen or if the condition fails to improve as anticipated.  I provided 23 minutes of non-face-to-face time during this encounter.   , NP

## 2021-05-07 DIAGNOSIS — Z3A01 Less than 8 weeks gestation of pregnancy: Secondary | ICD-10-CM | POA: Insufficient documentation

## 2021-05-07 NOTE — Assessment & Plan Note (Signed)
Stable.  [redacted] weeks gestation:  Please follow with OB as scheduled  Continue medications per OB  Follow up:  May follow up in 6 months with PCP

## 2021-05-12 ENCOUNTER — Other Ambulatory Visit: Payer: Self-pay | Admitting: Critical Care Medicine

## 2021-06-18 IMAGING — CT CT ABD-PELV W/ CM
2 of 4 series · 17 of 46 positions shown, 19 images · IV contrast (omnipaque)
Comparison: None.

CLINICAL DATA: Right lower quadrant pain

EXAM:
CT ABDOMEN AND PELVIS WITH CONTRAST
TECHNIQUE: Multidetector CT imaging of the abdomen and pelvis was performed
using the standard protocol following bolus administration of
intravenous contrast.
CONTRAST:  100mL OMNIPAQUE IOHEXOL 300 MG/ML  SOLN

[Series 2: axial st · axial · 0.94mm/px · z∈[+1015,+1465]mm · 14 of 102 slices shown, 16 images]
[im 6/102  soft-tissue]
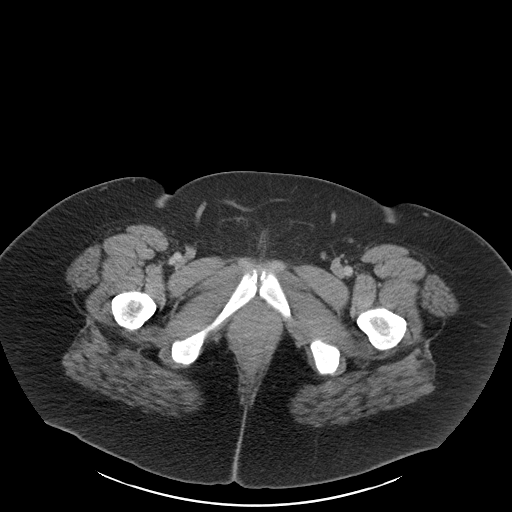
[im 6/102  bone]
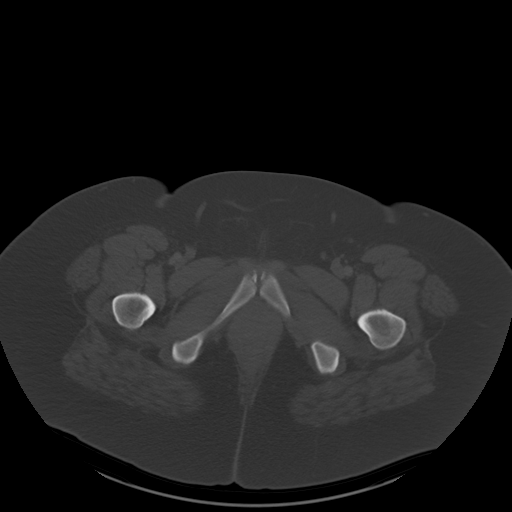
[im 12/102  soft-tissue]
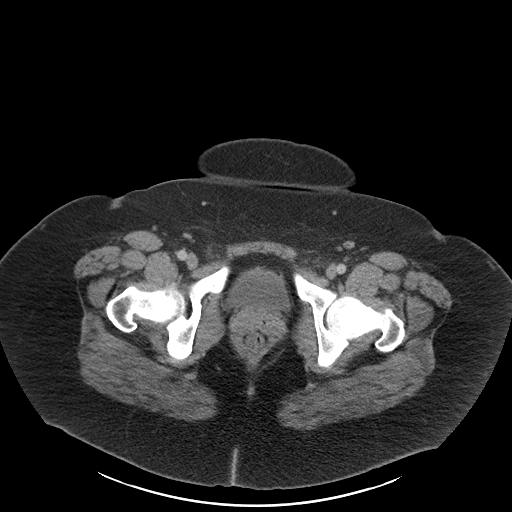
[im 18/102  soft-tissue]
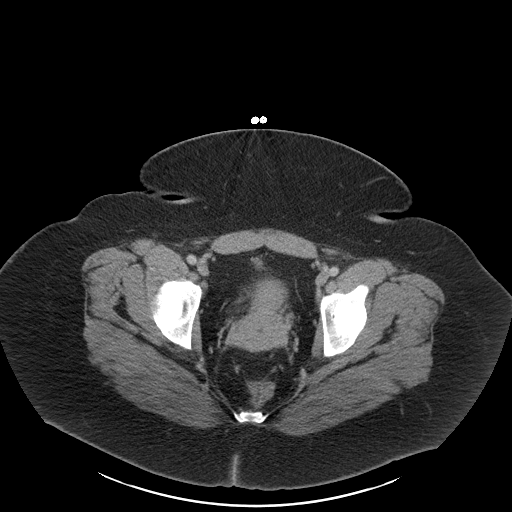
[im 30/102  soft-tissue]
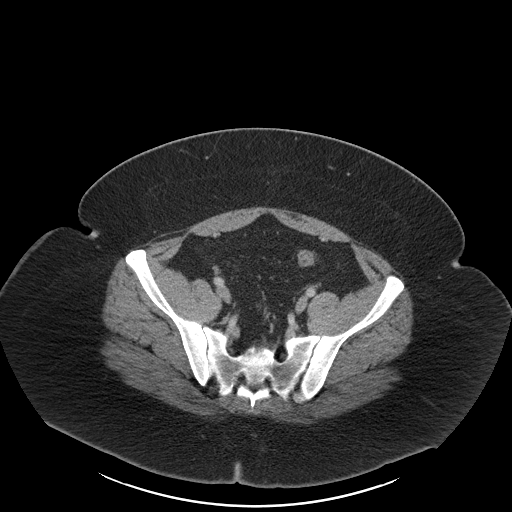
[im 36/102  soft-tissue]
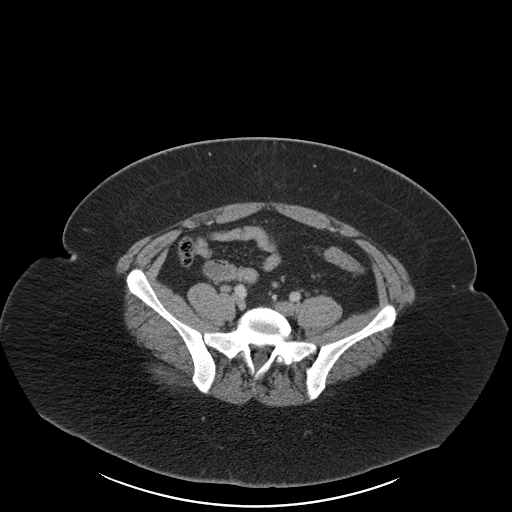
[im 42/102  soft-tissue]
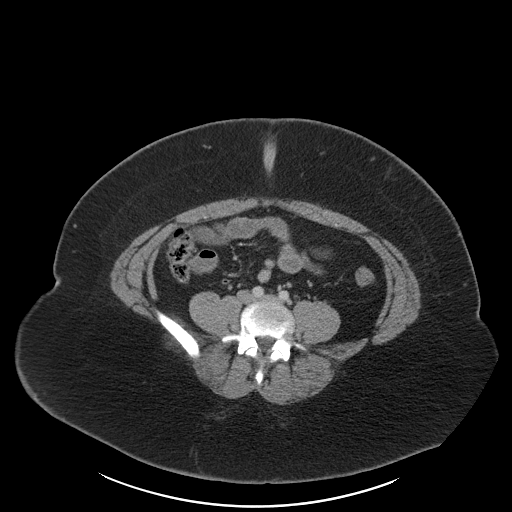
[im 48/102  soft-tissue]
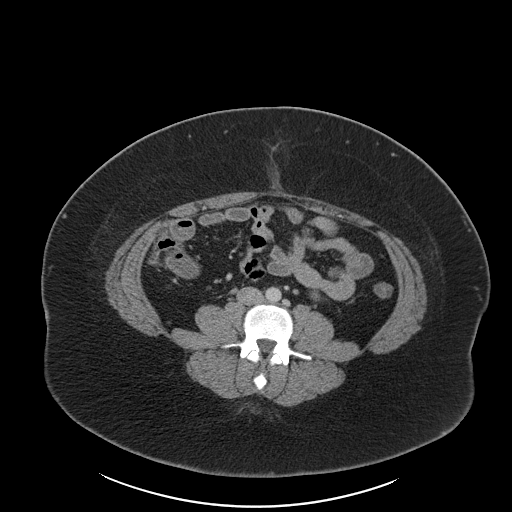
[im 54/102  soft-tissue]
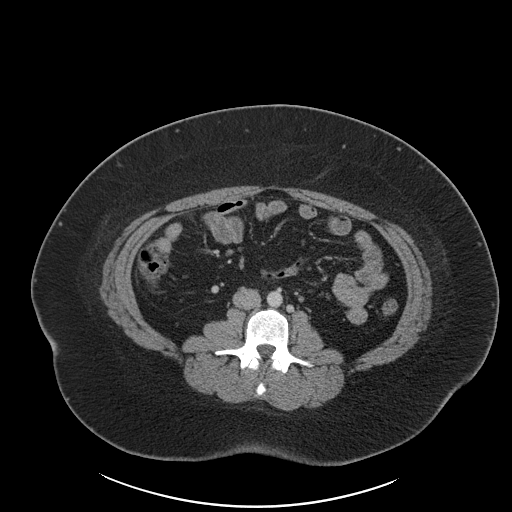
[im 60/102  soft-tissue]
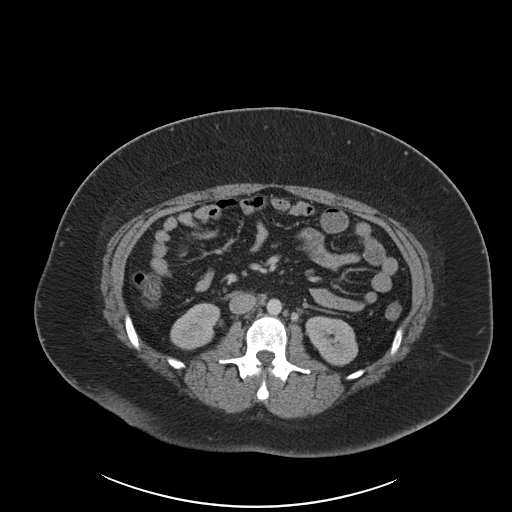
[im 60/102  bone]
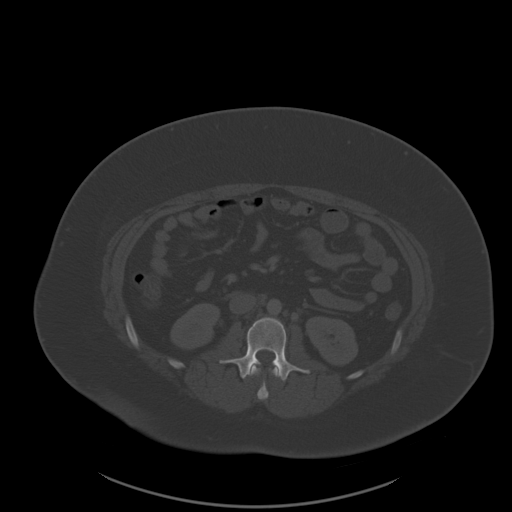
[im 66/102  soft-tissue]
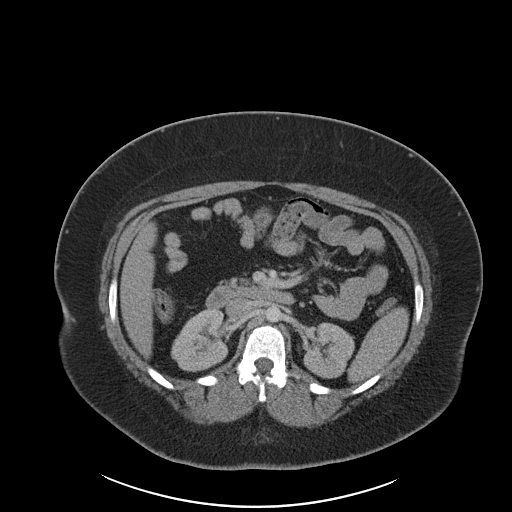
[im 78/102  soft-tissue]
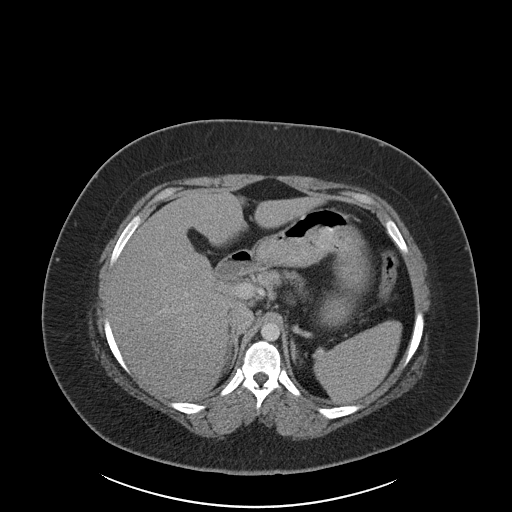
[im 84/102  soft-tissue]
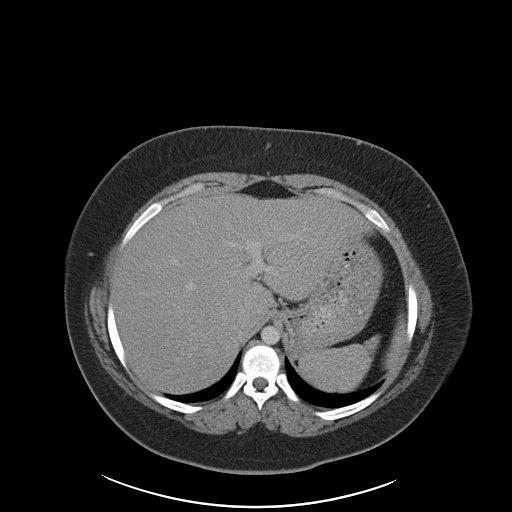
[im 90/102  soft-tissue]
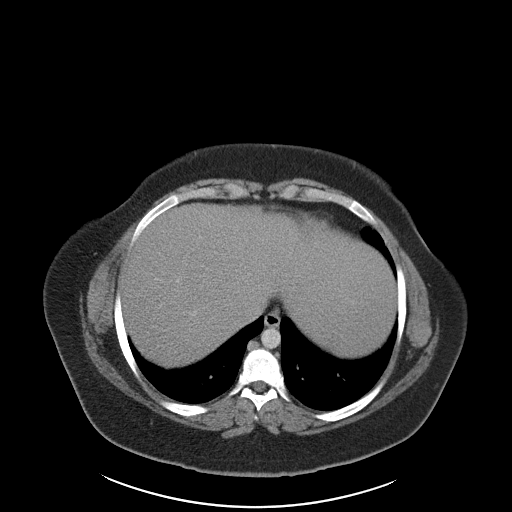
[im 96/102  soft-tissue]
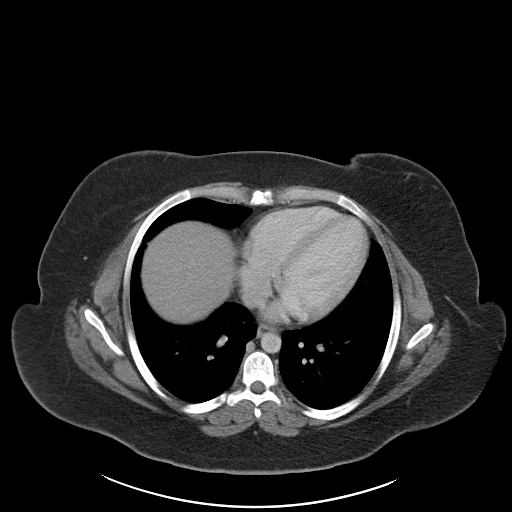

[Series 4: coronal st · coronal · 0.93mm/px · 3 of 182 slices shown]
[im 61/182  soft-tissue]
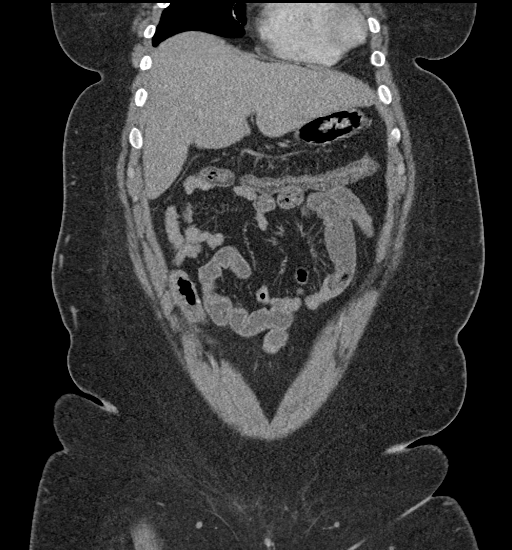
[im 81/182  soft-tissue]
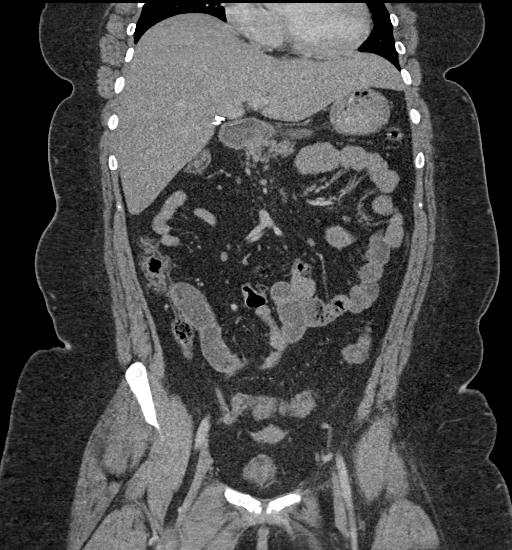
[im 101/182  soft-tissue]
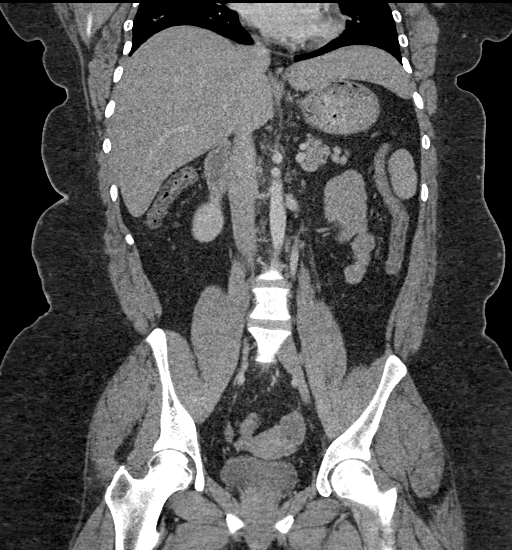

[17 of 46 positions shown; findings below may reference images not displayed]

FINDINGS: Lower chest: The visualized heart size within normal limits. No
pericardial fluid/thickening.

No hiatal hernia.

The visualized portions of the lungs are clear.

Hepatobiliary: The liver is normal in density without focal
abnormality.The main portal vein is patent. The patient is status
post cholecystectomy.

Pancreas: Unremarkable. No pancreatic ductal dilatation or
surrounding inflammatory changes.

Spleen: Normal in size without focal abnormality.

Adrenals/Urinary Tract: Both adrenal glands appear normal. There is
posterior calcifications seen in the lower pole of the right kidney
measuring 9 mm with mild right lower pole pelviectasis. No ureteral
calculi are noted. No left-sided renal calculus are seen. The
bladder is unremarkable.

Stomach/Bowel: The stomach, small bowel, and colon are normal in
appearance. No inflammatory changes, wall thickening, or obstructive
findings.The appendix is normal.

Vascular/Lymphatic: There are no enlarged mesenteric,
retroperitoneal, or pelvic lymph nodes. No significant vascular
findings are present.

Reproductive: The uterus and adnexa are unremarkable.

Other: No evidence of abdominal wall mass or hernia.

Musculoskeletal: No acute or significant osseous findings.
IMPRESSION: Normal appearing appendix.

Right renal lower pole calculus with mild pelviectasis.

## 2021-06-19 ENCOUNTER — Telehealth: Payer: Self-pay | Admitting: Gastroenterology

## 2021-06-19 NOTE — Telephone Encounter (Signed)
Called patient to remind her of her procedure on 06-25-2021.  Patient stated she wanted to cancel appointment she stated she has moved and is pregnant. She stated she  will call  back to reschedule at a later date.

## 2021-06-24 NOTE — Telephone Encounter (Signed)
ok 

## 2021-06-25 ENCOUNTER — Encounter: Payer: Medicaid Other | Admitting: Gastroenterology

## 2022-07-15 ENCOUNTER — Encounter (INDEPENDENT_AMBULATORY_CARE_PROVIDER_SITE_OTHER): Payer: Self-pay
